# Patient Record
Sex: Male | Born: 1985
Health system: Southern US, Community
[De-identification: ages and names within clinical notes are randomized; demographics above are authoritative.]

---
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
## 2005-11-12 ENCOUNTER — Emergency Department (HOSPITAL_COMMUNITY): Admission: EM | Admit: 2005-11-12 | Discharge: 2005-11-12 | Payer: Self-pay | Admitting: Emergency Medicine

## 2006-02-05 ENCOUNTER — Emergency Department (HOSPITAL_COMMUNITY): Admission: EM | Admit: 2006-02-05 | Discharge: 2006-02-05 | Payer: Self-pay | Admitting: Emergency Medicine

## 2006-12-05 IMAGING — CR DG TIBIA/FIBULA 2V*L*
4 series · 4 of 4 positions shown · non-contrast
Comparison: none

HISTORY: Laceration, fall striking pipe

LEFT TIBIA-FIBULA 2 VIEWS:
No fracture, dislocation, or bone destruction.
Joint spaces preserved.  
Mineralization normal.

[t tib/fib ap left (1 of 2)]
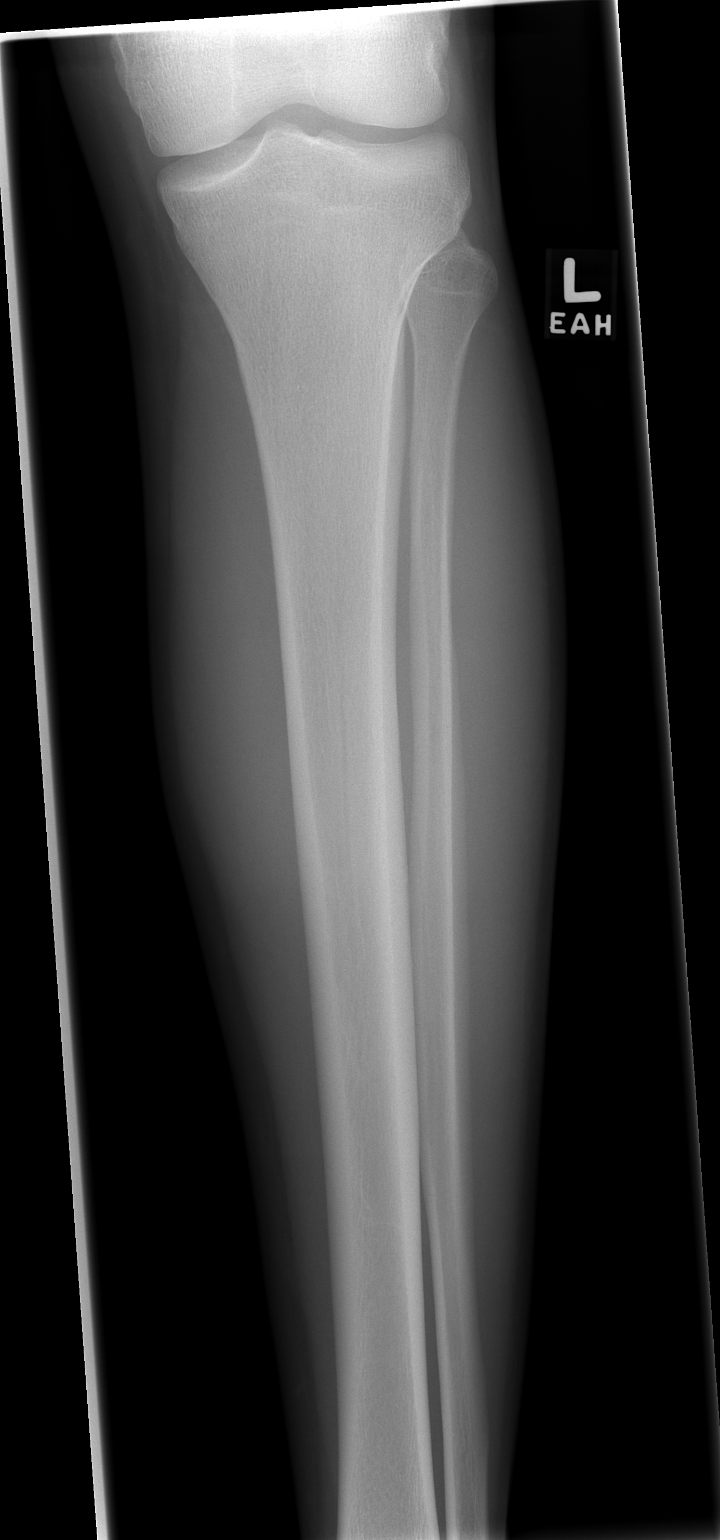

[t tib/fib ap left (2 of 2)]
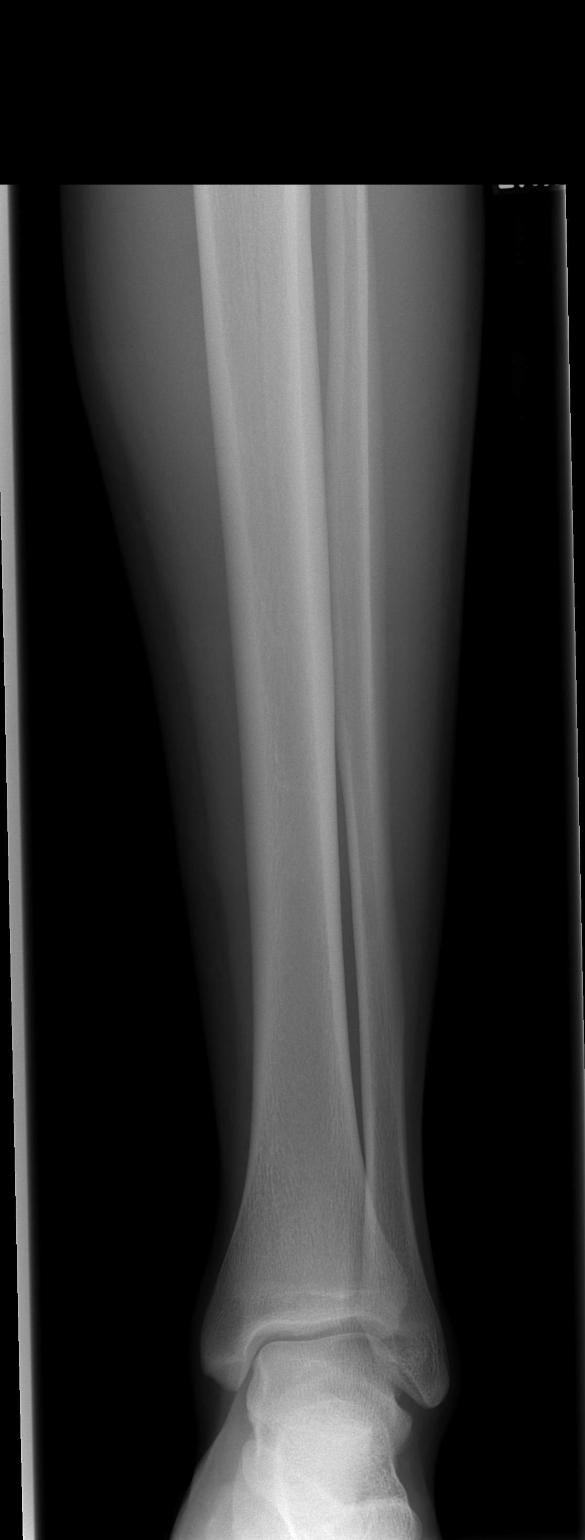

[t tib/fib lat left (1 of 2)]
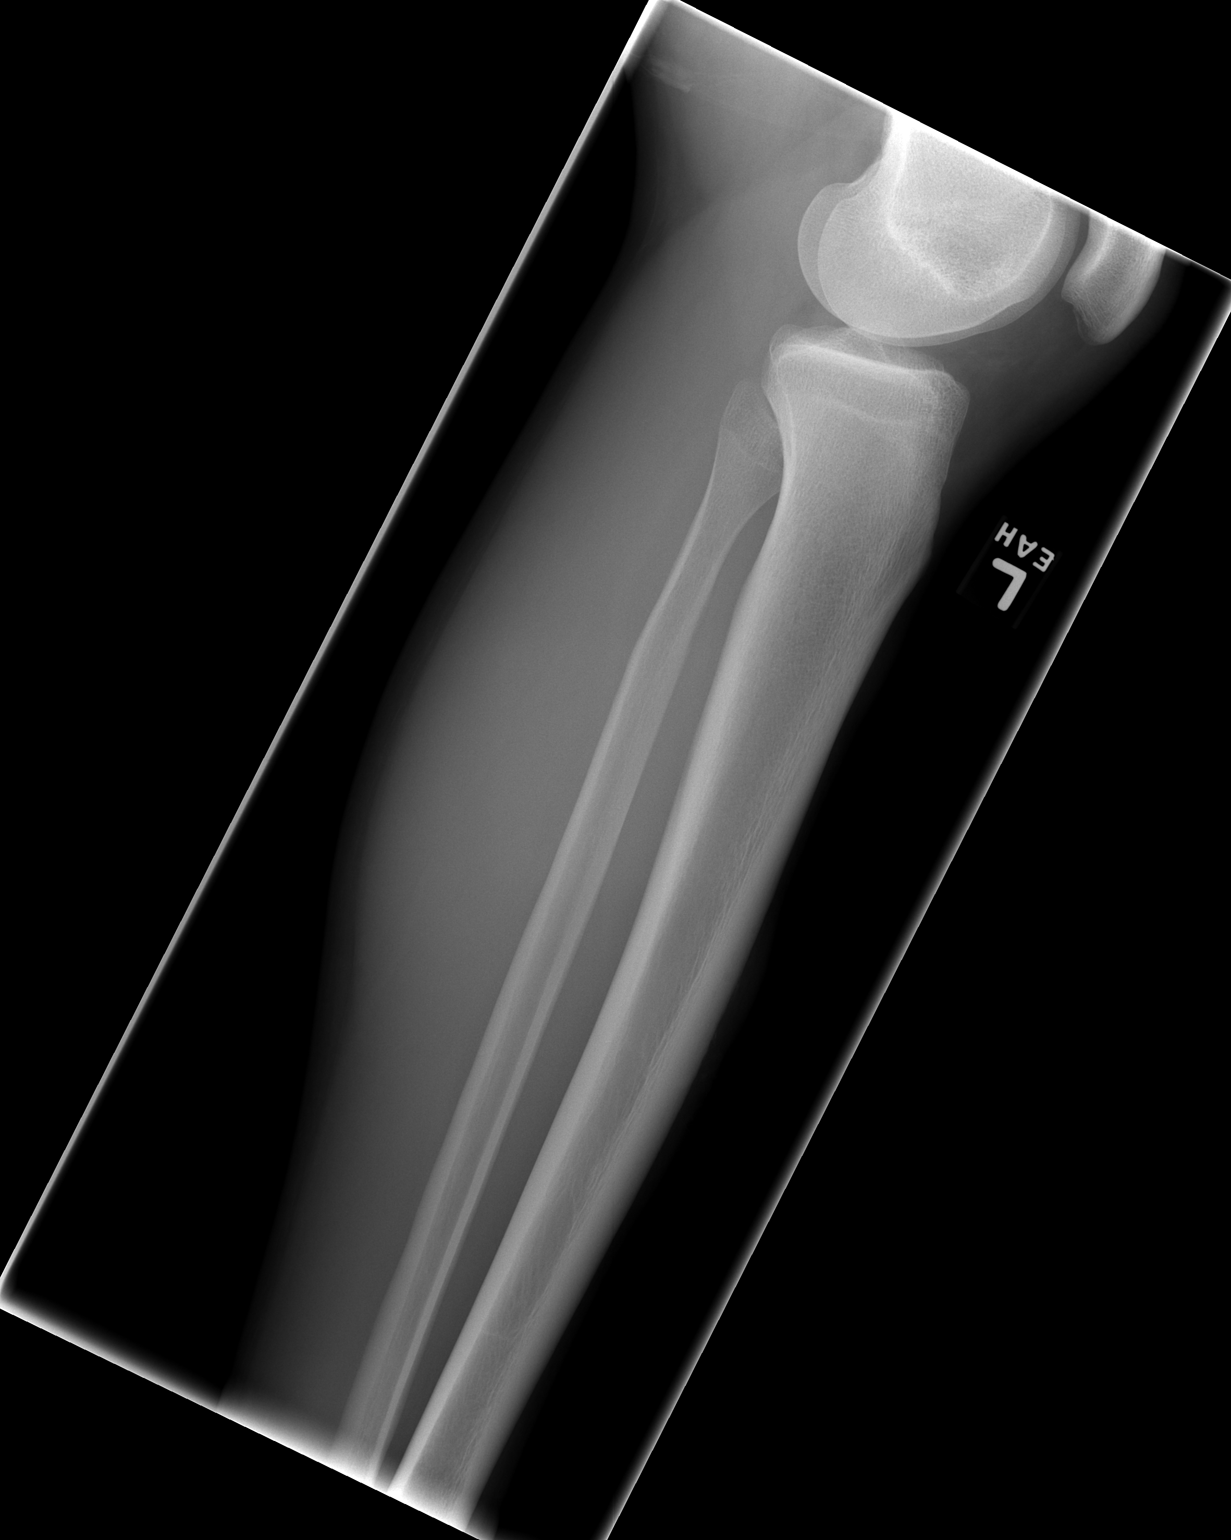

[t tib/fib lat left (2 of 2)]
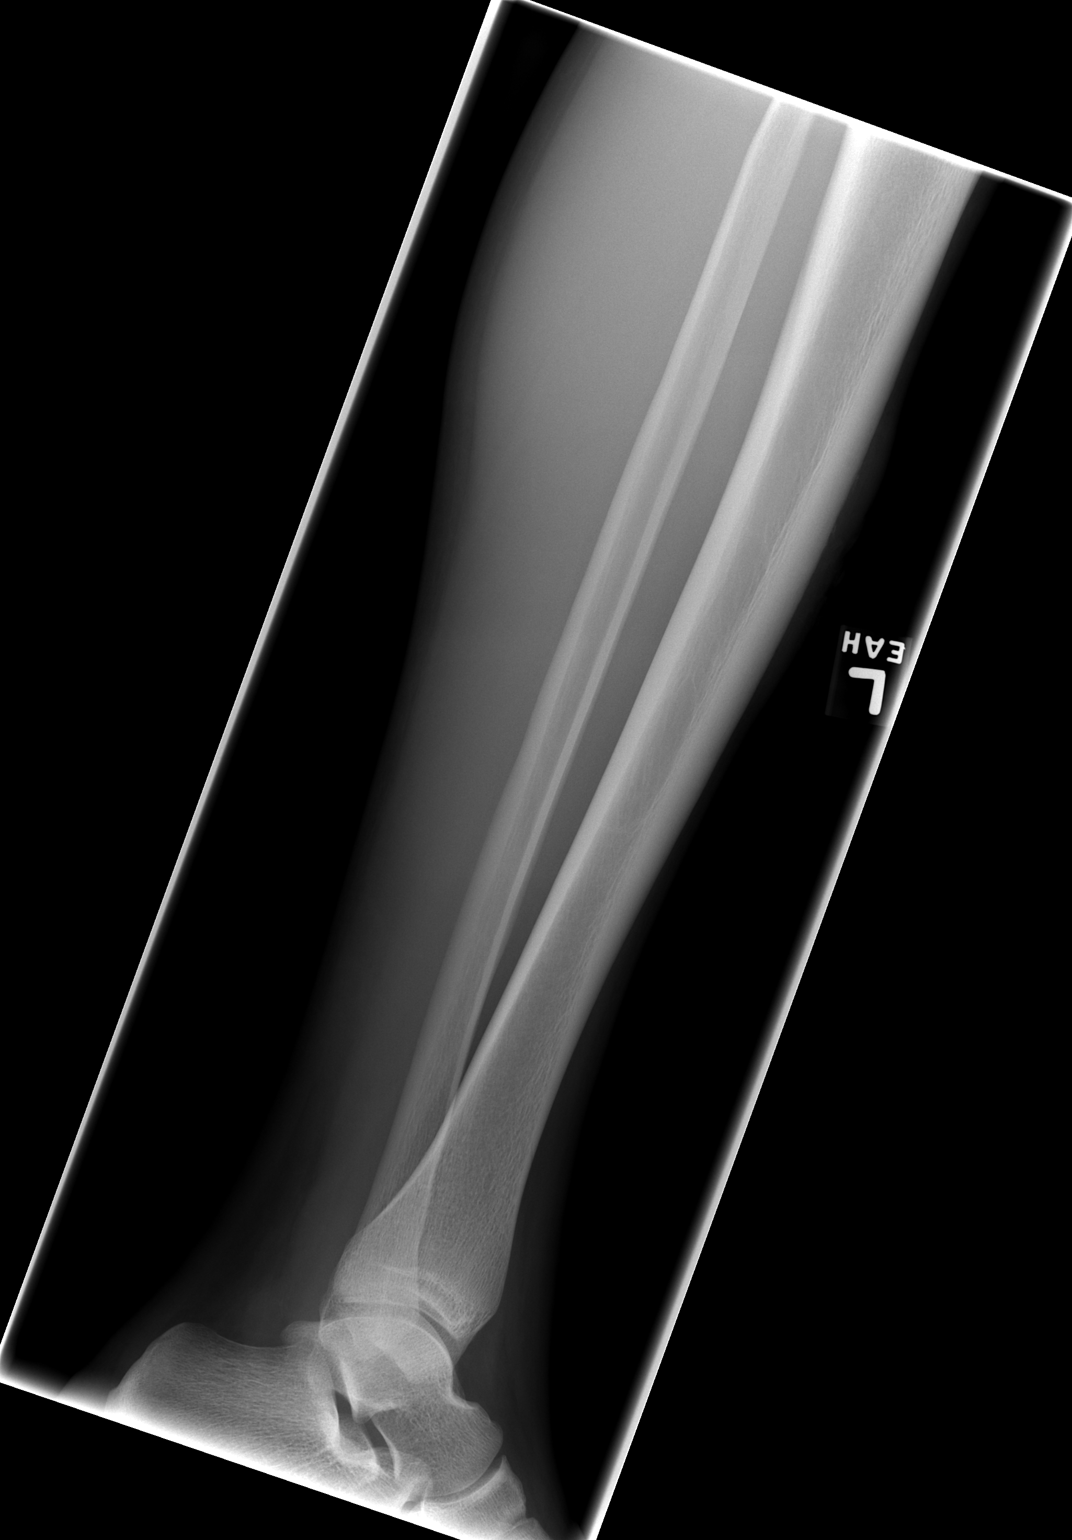

[4 of 4 positions shown; findings below may reference images not displayed]

IMPRESSION: No acute abnormalities.

## 2012-03-17 ENCOUNTER — Ambulatory Visit (INDEPENDENT_AMBULATORY_CARE_PROVIDER_SITE_OTHER): Payer: BC Managed Care – PPO | Admitting: Emergency Medicine

## 2012-03-17 VITALS — BP 110/70 | HR 77 | Temp 98.6°F | Resp 16 | Ht 71.0 in | Wt 183.0 lb

## 2012-03-17 DIAGNOSIS — L0390 Cellulitis, unspecified: Secondary | ICD-10-CM

## 2012-03-17 DIAGNOSIS — L0291 Cutaneous abscess, unspecified: Secondary | ICD-10-CM

## 2012-03-17 MED ORDER — SULFAMETHOXAZOLE-TRIMETHOPRIM 800-160 MG PO TABS: 1.0000 | ORAL_TABLET | Freq: Two times a day (BID) | ORAL | Status: AC

## 2012-03-17 NOTE — Patient Instructions (Signed)

## 2012-03-17 NOTE — Progress Notes (Signed)
  Subjective:    Patient ID: Samuel Neal, male    DOB: [birth year], 26 y.o.   MRN: [id number]  HPI Comments: Sutured left knee and forehead a week ago.  Now has cellulitis around knee wound  Wound Check He was originally treated 5 to 10 days ago. Previous treatment included laceration repair. There has been no drainage from the wound. There is new redness present. There is new swelling present. The pain has new pain. He has no difficulty moving the affected extremity or digit.      Review of Systems  All other systems reviewed and are negative.       Objective:   Physical Exam  Constitutional: He appears well-developed and well-nourished.  HENT:  Head: Normocephalic and atraumatic.  Eyes: Conjunctivae are normal. Pupils are equal, round, and reactive to light.  Neck: Normal range of motion.  Cardiovascular: Normal rate.   Pulmonary/Chest: Effort normal.  Abdominal: Soft.  Skin: Skin is warm and dry. There is erythema.          Assessment & Plan:  Scalp sutures removed and cellulitis knee treated with septra Follow up if no improvement otherwise in one week for suture removal

## 2016-04-11 ENCOUNTER — Ambulatory Visit: Payer: Self-pay | Admitting: Family

## 2016-04-21 ENCOUNTER — Encounter: Payer: Self-pay | Admitting: Family

## 2016-04-21 ENCOUNTER — Ambulatory Visit (INDEPENDENT_AMBULATORY_CARE_PROVIDER_SITE_OTHER): Payer: BLUE CROSS/BLUE SHIELD | Admitting: Family

## 2016-04-21 DIAGNOSIS — Z0000 Encounter for general adult medical examination without abnormal findings: Secondary | ICD-10-CM | POA: Diagnosis not present

## 2016-04-21 DIAGNOSIS — Z0001 Encounter for general adult medical examination with abnormal findings: Secondary | ICD-10-CM | POA: Insufficient documentation

## 2016-04-21 NOTE — Patient Instructions (Addendum)
Thank you for choosing [facility].  Summary/Instructions:  Please stop by the lab on the lower level of the building for your blood work. Your results will be released to MyChart (or called to you) after review, usually within 72 hours after test completion. If any changes need to be made, you will be notified at that same time.  1. The lab is open from 7:30am to 5:30 pm Monday-Friday  2. No appointment is necessary  3. Fasting (if needed) is 6-8 hours after food and drink; black coffee  and water are okay   If your symptoms worsen or fail to improve, please contact our office for further instruction, or in case of emergency go directly to the emergency room at the closest medical facility.   Health Maintenance, Male A healthy lifestyle and preventative care can promote health and wellness.  Maintain regular health, dental, and eye exams.  Eat a healthy diet. Foods like vegetables, fruits, whole grains, low-fat dairy products, and lean protein foods contain the nutrients you need and are low in calories. Decrease your intake of foods high in solid fats, added sugars, and salt. Get information about a proper diet from your health care provider, if necessary.  Regular physical exercise is one of the most important things you can do for your health. Most adults should get at least 150 minutes of moderate-intensity exercise (any activity that increases your heart rate and causes you to sweat) each week. In addition, most adults need muscle-strengthening exercises on 2 or more days a week.   Maintain a healthy weight. The body mass index (BMI) is a screening tool to identify possible weight problems. It provides an estimate of body fat based on height and weight. Your health care provider can find your BMI and can help you achieve or maintain a healthy weight. For males 20 years and older:  A BMI below 18.5 is considered underweight.  A BMI of 18.5 to 24.9 is normal.  A BMI of 25 to  29.9 is considered overweight.  A BMI of 30 and above is considered obese.  Maintain normal blood lipids and cholesterol by exercising and minimizing your intake of saturated fat. Eat a balanced diet with plenty of fruits and vegetables. Blood tests for lipids and cholesterol should begin at age 20 and be repeated every 5 years. If your lipid or cholesterol levels are high, you are over age 50, or you are at high risk for heart disease, you may need your cholesterol levels checked more frequently.Ongoing high lipid and cholesterol levels should be treated with medicines if diet and exercise are not working.  If you smoke, find out from your health care provider how to quit. If you do not use tobacco, do not start.  Lung cancer screening is recommended for adults aged 55-80 years who are at high risk for developing lung cancer because of a history of smoking. A yearly low-dose CT scan of the lungs is recommended for people who have at least a 30-pack-year history of smoking and are current smokers or have quit within the past 15 years. A pack year of smoking is smoking an average of 1 pack of cigarettes a day for 1 year (for example, a 30-pack-year history of smoking could mean smoking 1 pack a day for 30 years or 2 packs a day for 15 years). Yearly screening should continue until the smoker has stopped smoking for at least 15 years. Yearly screening should be stopped for people who develop a   health problem that would prevent them from having lung cancer treatment.  If you choose to drink alcohol, do not have more than 2 drinks per day. One drink is considered to be 12 oz (360 mL) of beer, 5 oz (150 mL) of wine, or 1.5 oz (45 mL) of liquor.  Avoid the use of street drugs. Do not share needles with anyone. Ask for help if you need support or instructions about stopping the use of drugs.  High blood pressure causes heart disease and increases the risk of stroke. High blood pressure is more likely to  develop in:  People who have blood pressure in the end of the normal range (100-139/85-89 mm Hg).  People who are overweight or obese.  People who are African American.  If you are 18-39 years of age, have your blood pressure checked every 3-5 years. If you are 40 years of age or older, have your blood pressure checked every year. You should have your blood pressure measured twice--once when you are at a hospital or clinic, and once when you are not at a hospital or clinic. Record the average of the two measurements. To check your blood pressure when you are not at a hospital or clinic, you can use:  An automated blood pressure machine at a pharmacy.  A home blood pressure monitor.  If you are 45-79 years old, ask your health care provider if you should take aspirin to prevent heart disease.  Diabetes screening involves taking a blood sample to check your fasting blood sugar level. This should be done once every 3 years after age 45 if you are at a normal weight and without risk factors for diabetes. Testing should be considered at a younger age or be carried out more frequently if you are overweight and have at least 1 risk factor for diabetes.  Colorectal cancer can be detected and often prevented. Most routine colorectal cancer screening begins at the age of 50 and continues through age 75. However, your health care provider may recommend screening at an earlier age if you have risk factors for colon cancer. On a yearly basis, your health care provider may provide home test kits to check for hidden blood in the stool. A small camera at the end of a tube may be used to directly examine the colon (sigmoidoscopy or colonoscopy) to detect the earliest forms of colorectal cancer. Talk to your health care provider about this at age 50 when routine screening begins. A direct exam of the colon should be repeated every 5-10 years through age 75, unless early forms of precancerous polyps or small growths  are found.  People who are at an increased risk for hepatitis B should be screened for this virus. You are considered at high risk for hepatitis B if:  You were born in a country where hepatitis B occurs often. Talk with your health care provider about which countries are considered high risk.  Your parents were born in a high-risk country and you have not received a shot to protect against hepatitis B (hepatitis B vaccine).  You have HIV or AIDS.  You use needles to inject street drugs.  You live with, or have sex with, someone who has hepatitis B.  You are a man who has sex with other men (MSM).  You get hemodialysis treatment.  You take certain medicines for conditions like cancer, organ transplantation, and autoimmune conditions.  Hepatitis C blood testing is recommended for all people born from 1945   through 1965 and any individual with known risk factors for hepatitis C.  Healthy men should no longer receive prostate-specific antigen (PSA) blood tests as part of routine cancer screening. Talk to your health care provider about prostate cancer screening.  Testicular cancer screening is not recommended for adolescents or adult males who have no symptoms. Screening includes self-exam, a health care provider exam, and other screening tests. Consult with your health care provider about any symptoms you have or any concerns you have about testicular cancer.  Practice safe sex. Use condoms and avoid high-risk sexual practices to reduce the spread of sexually transmitted infections (STIs).  You should be screened for STIs, including gonorrhea and chlamydia if:  You are sexually active and are younger than 24 years.  You are older than 24 years, and your health care provider tells you that you are at risk for this type of infection.  Your sexual activity has changed since you were last screened, and you are at an increased risk for chlamydia or gonorrhea. Ask your health care provider  if you are at risk.  If you are at risk of being infected with HIV, it is recommended that you take a prescription medicine daily to prevent HIV infection. This is called pre-exposure prophylaxis (PrEP). You are considered at risk if:  You are a man who has sex with other men (MSM).  You are a heterosexual man who is sexually active with multiple partners.  You take drugs by injection.  You are sexually active with a partner who has HIV.  Talk with your health care provider about whether you are at high risk of being infected with HIV. If you choose to begin PrEP, you should first be tested for HIV. You should then be tested every 3 months for as long as you are taking PrEP.  Use sunscreen. Apply sunscreen liberally and repeatedly throughout the day. You should seek shade when your shadow is shorter than you. Protect yourself by wearing long sleeves, pants, a wide-brimmed hat, and sunglasses year round whenever you are outdoors.  Tell your health care provider of new moles or changes in moles, especially if there is a change in shape or color. Also, tell your health care provider if a mole is larger than the size of a pencil eraser.  A one-time screening for abdominal aortic aneurysm (AAA) and surgical repair of large AAAs by ultrasound is recommended for men aged 65-75 years who are current or former smokers.  Stay current with your vaccines (immunizations).   This information is not intended to replace advice given to you by your health care provider. Make sure you discuss any questions you have with your health care provider.   Document Released: 02/25/2008 Document Revised: 09/19/2014 Document Reviewed: 01/24/2011 Elsevier Interactive Patient Education 2016 [employer]   

## 2016-04-21 NOTE — Assessment & Plan Note (Signed)
1) Anticipatory Guidance: Discussed importance of wearing a seatbelt while driving and not texting while driving; changing batteries in smoke detector at least once annually; wearing suntan lotion when outside; eating a balanced and moderate diet; getting physical activity at least 30 minutes per day.  2) Immunizations / Screenings / Labs:  All immunizations are up-to-date per recommendations. Obtain testosterone for testosterone screening. Due for a dental exam encouraged to be completed independently. All other screenings are up-to-date per recommendations. Obtain CBC, CMET, Lipid profile and TSH.    Overall well exam with risk factors for cardiovascular disease being minimal at this time. He is of good weight and exercises regularly. Emphasize importance of good nutritional intake that is moderate, balance, and varied. Continue healthy last behaviors and choices. Follow-up prevention exam in 1 year. Follow-up office visit pending blood work as necessary.

## 2016-04-21 NOTE — Progress Notes (Signed)
Subjective:    Patient ID: Samuel Neal, male    DOB: 04/04/1986, 30 y.o.   MRN: [id number]  Chief Complaint  Patient presents with  . Establish Care    CPE, not fasting    HPI:  CRISTOFHER Neal is a 30 y.o. male who presents today for an annual wellness visit.   1) Health Maintenance -   Diet - Averages about 3 meals per day consisting of a regular diet; Rare fast/processed foods; Caffeine intake of 4-5 cups per day.   Exercise - Couple of times per week; mountain bike   2) Preventative Exams / Immunizations:  Dental -- Due for exam  Vision -- Up to date   Health Maintenance  Topic Date Due  . HIV Screening  04/28/2001  . INFLUENZA VACCINE  04/12/2016  . TETANUS/TDAP  11/11/2019     There is no immunization history on file for this patient.  No Known Allergies   No outpatient prescriptions prior to visit.   No facility-administered medications prior to visit.      History reviewed. No pertinent past medical history.   History reviewed. No pertinent surgical history.   Family History  Problem Relation Age of Onset  . Healthy Mother   . Heart disease Father   . Healthy Maternal Grandfather   . Glaucoma Paternal Grandmother   . Cancer Paternal Grandfather      Social History   Social History  . Marital status: Married    Spouse name: N/A  . Number of children: 0  . Years of education: 49   Occupational History  . Sales    Social History Main Topics  . Smoking status: Never Smoker  . Smokeless tobacco: Never Used  . Alcohol use 12.6 oz/week    21 Cans of beer per week  . Drug use: No  . Sexual activity: Not on file   Other Topics Concern  . Not on file   Social History Narrative   Fun: Mountain bike, motor cycles      Review of Systems  Constitutional: Denies fever, chills, fatigue, or significant weight gain/loss. HENT: Head: Denies headache or neck pain Ears: Denies changes in hearing, ringing in ears, earache,  drainage Nose: Denies discharge, stuffiness, itching, nosebleed, sinus pain Throat: Denies sore throat, hoarseness, dry mouth, sores, thrush Eyes: Denies loss/changes in vision, pain, redness, blurry/double vision, flashing lights Cardiovascular: Denies chest pain/discomfort, tightness, palpitations, shortness of breath with activity, difficulty lying down, swelling, sudden awakening with shortness of breath Respiratory: Denies shortness of breath, cough, sputum production, wheezing Gastrointestinal: Denies dysphasia, heartburn, change in appetite, nausea, change in bowel habits, rectal bleeding, constipation, diarrhea, yellow skin or eyes Genitourinary: Denies frequency, urgency, burning/pain, blood in urine, incontinence, change in urinary strength. Musculoskeletal: Denies muscle/joint pain, stiffness, back pain, redness or swelling of joints, trauma Skin: Denies rashes, lumps, itching, dryness, color changes, or hair/nail changes Neurological: Denies dizziness, fainting, seizures, weakness, numbness, tingling, tremor Psychiatric - Denies nervousness, stress, depression or memory loss Endocrine: Denies heat or cold intolerance, sweating, frequent urination, excessive thirst, changes in appetite Hematologic: Denies ease of bruising or bleeding     Objective:     BP 136/84 (BP Location: Left Arm, Patient Position: Sitting, Cuff Size: Normal)   Pulse 69   Temp 98.2 F (36.8 C) (Oral)   Resp 16   Ht  (1.803 m)   Wt 165 lb (74.8 kg)   SpO2 96%   BMI 23.01 kg/m  Nursing note  and vital signs reviewed.  Physical Exam  Constitutional: He is oriented to person, place, and time. He appears well-developed and well-nourished.  HENT:  Head: Normocephalic.  Right Ear: Hearing, tympanic membrane, external ear and ear canal normal.  Left Ear: Hearing, tympanic membrane, external ear and ear canal normal.  Nose: Nose normal.  Mouth/Throat: Uvula is midline, oropharynx is clear and moist  and mucous membranes are normal.  Eyes: Conjunctivae and EOM are normal. Pupils are equal, round, and reactive to light.  Neck: Neck supple. No JVD present. No tracheal deviation present. No thyromegaly present.  Cardiovascular: Normal rate, regular rhythm, normal heart sounds and intact distal pulses.   Pulmonary/Chest: Effort normal and breath sounds normal.  Abdominal: Soft. Bowel sounds are normal. He exhibits no distension and no mass. There is no tenderness. There is no rebound and no guarding.  Musculoskeletal: Normal range of motion. He exhibits no edema or tenderness.  Lymphadenopathy:    He has no cervical adenopathy.  Neurological: He is alert and oriented to person, place, and time. He has normal reflexes. No cranial nerve deficit. He exhibits normal muscle tone. Coordination normal.  Skin: Skin is warm and dry.  Psychiatric: He has a normal mood and affect. His behavior is normal. Judgment and thought content normal.       Assessment & Plan:   Problem List Items Addressed This Visit      Other   Routine general medical examination at a health care facility    1) Anticipatory Guidance: Discussed importance of wearing a seatbelt while driving and not texting while driving; changing batteries in smoke detector at least once annually; wearing suntan lotion when outside; eating a balanced and moderate diet; getting physical activity at least 30 minutes per day.  2) Immunizations / Screenings / Labs:  All immunizations are up-to-date per recommendations. Obtain testosterone for testosterone screening. Due for a dental exam encouraged to be completed independently. All other screenings are up-to-date per recommendations. Obtain CBC, CMET, Lipid profile and TSH.    Overall well exam with risk factors for cardiovascular disease being minimal at this time. He is of good weight and exercises regularly. Emphasize importance of good nutritional intake that is moderate, balance, and varied.  Continue healthy last behaviors and choices. Follow-up prevention exam in 1 year. Follow-up office visit pending blood work as necessary.       Relevant Orders   CBC   Comprehensive metabolic panel   Lipid panel   TSH   Testosterone    Other Visit Diagnoses   None.      Mr. Jenne PaneBates does not currently have medications on file.    Follow-up: Return in about 1 year (around 04/21/2017), or if symptoms worsen or fail to improve.   Jeanine Luzalone, [place], FNP

## 2016-05-19 ENCOUNTER — Other Ambulatory Visit (INDEPENDENT_AMBULATORY_CARE_PROVIDER_SITE_OTHER): Payer: BLUE CROSS/BLUE SHIELD

## 2016-05-19 ENCOUNTER — Encounter: Payer: Self-pay | Admitting: Family

## 2016-05-19 DIAGNOSIS — Z0000 Encounter for general adult medical examination without abnormal findings: Secondary | ICD-10-CM | POA: Diagnosis not present

## 2016-05-19 LAB — CBC
HCT: 41.5 % (ref 39.0–52.0)
Hemoglobin: 14.4 g/dL (ref 13.0–17.0)
MCHC: 34.7 g/dL (ref 30.0–36.0)
MCV: 88.4 fl (ref 78.0–100.0)
Platelets: 168 10*3/uL (ref 150.0–400.0)
RBC: 4.7 Mil/uL (ref 4.22–5.81)
RDW: 12.1 % (ref 11.5–15.5)
WBC: 5 10*3/uL (ref 4.0–10.5)

## 2016-05-19 LAB — LIPID PANEL
Cholesterol: 195 mg/dL (ref 0–200)
HDL: 56.9 mg/dL (ref >39.00)
LDL Cholesterol: 119 mg/dL — ABNORMAL HIGH (ref 0–99)
NonHDL: 137.75
Total CHOL/HDL Ratio: 3
Triglycerides: 96 mg/dL (ref 0.0–149.0)
VLDL: 19.2 mg/dL (ref 0.0–40.0)

## 2016-05-19 LAB — COMPREHENSIVE METABOLIC PANEL
ALT: 28 U/L (ref 0–53)
AST: 21 U/L (ref 0–37)
Albumin: 4.8 g/dL (ref 3.5–5.2)
Alkaline Phosphatase: 64 U/L (ref 39–117)
BUN: 9 mg/dL (ref 6–23)
CO2: 29 mEq/L (ref 19–32)
Calcium: 9.4 mg/dL (ref 8.4–10.5)
Chloride: 100 mEq/L (ref 96–112)
Creatinine, Ser: 0.96 mg/dL (ref 0.40–1.50)
GFR: 97.71 mL/min (ref >60.00)
Glucose, Bld: 92 mg/dL (ref 70–99)
Potassium: 4.2 mEq/L (ref 3.5–5.1)
Sodium: 136 mEq/L (ref 135–145)
Total Bilirubin: 0.8 mg/dL (ref 0.2–1.2)
Total Protein: 7.3 g/dL (ref 6.0–8.3)

## 2016-05-19 LAB — TESTOSTERONE: Testosterone: 316.48 ng/dL (ref 300.00–890.00)

## 2016-05-19 LAB — TSH: TSH: 1.23 u[IU]/mL (ref 0.35–4.50)

## 2017-12-04 ENCOUNTER — Ambulatory Visit: Payer: BLUE CROSS/BLUE SHIELD | Admitting: Family

## 2017-12-04 ENCOUNTER — Encounter: Payer: Self-pay | Admitting: Family

## 2017-12-04 VITALS — BP 110/78 | HR 89 | Temp 98.3°F | Ht 71.0 in | Wt 171.1 lb

## 2017-12-04 DIAGNOSIS — Z23 Encounter for immunization: Secondary | ICD-10-CM | POA: Diagnosis not present

## 2017-12-04 DIAGNOSIS — T148XXA Other injury of unspecified body region, initial encounter: Secondary | ICD-10-CM | POA: Diagnosis not present

## 2017-12-04 MED ORDER — MUPIROCIN CALCIUM 2 % EX CREA: 1.0000 "application " | TOPICAL_CREAM | Freq: Two times a day (BID) | CUTANEOUS | 0 refills | Status: DC

## 2017-12-04 NOTE — Patient Instructions (Signed)

## 2017-12-04 NOTE — Progress Notes (Signed)
  Samuel ParkerChristopher K Neal is a 32 y.o. male with the following history as recorded in [facility]:  Patient Active Problem List   Diagnosis Date Noted  . Routine general medical examination at a health care facility 04/21/2016    Current Outpatient Medications  Medication Sig Dispense Refill  . mupirocin cream (BACTROBAN) 2 % Apply 1 application topically 2 (two) times daily. 15 g 0   No current facility-administered medications for this visit.     Allergies: Patient has no known allergies.  History reviewed. No pertinent past medical history.  History reviewed. No pertinent surgical history.  Family History  Problem Relation Age of Onset  . Healthy Mother   . Heart disease Father   . Healthy Maternal Grandfather   . Glaucoma Paternal Grandmother   . Cancer Paternal Grandfather     Social History   Tobacco Use  . Smoking status: Never Smoker  . Smokeless tobacco: Never Used  Substance Use Topics  . Alcohol use: Yes    Alcohol/week: 12.6 oz    Types: 21 Cans of beer per week    Subjective:  Patient presents with concerns for cut on bottom of 1st toe right foot; stepped on rusty nail/ board on Saturday night and cut his foot; has been cleaning with Hydrogen Peroxide; notes that mildly painful to step on toe; had a family member who is a paramedic evaluate toe on Saturday night after injury and did not feel sutures needed; also prefers not to take any type of oral antibiotic unless absolutely necessary.   Objective:  Vitals:   12/04/17 0826  BP: 110/78  Pulse: 89  Temp: 98.3 F (36.8 C)  TempSrc: Oral  SpO2: 98%  Weight: 171 lb 1.4 oz (77.6 kg)  Height: 5\' 11"  (1.803 m)    General: Well developed, well nourished, in no acute distress  Skin : Warm and dry. Laceration noted on bottom of 1st toe right foot- blood blister noted on bottom of toe; no warmth or streaking;  Head: Normocephalic and atraumatic  Lungs: Respirations unlabored; clear to auscultation bilaterally without  wheeze, rales, rhonchi  Neurologic: Alert and oriented; speech intact; face symmetrical; moves all extremities well; CNII-XII intact without focal deficit   Assessment:  1. Wound of skin     Plan:  Tdap updated; will treat with Bactroban; encouraged to keep area clean, covered; follow-up worse, no better.  Consider CPE later this year as last exam in 04/2016.  No follow-ups on file.  Orders Placed This Encounter  Procedures  . Tdap vaccine greater than or equal to 7yo IM    Requested Prescriptions   Signed Prescriptions Disp Refills  . mupirocin cream (BACTROBAN) 2 % 15 g 0    Sig: Apply 1 application topically 2 (two) times daily.

## 2017-12-07 ENCOUNTER — Telehealth: Payer: Self-pay

## 2017-12-07 NOTE — Telephone Encounter (Signed)
Key: [id number]  Started PA for Mupirocin Cream today on Cover My Meds.

## 2017-12-11 ENCOUNTER — Other Ambulatory Visit (INDEPENDENT_AMBULATORY_CARE_PROVIDER_SITE_OTHER): Payer: BLUE CROSS/BLUE SHIELD

## 2017-12-11 ENCOUNTER — Ambulatory Visit: Payer: BLUE CROSS/BLUE SHIELD | Admitting: Internal Medicine

## 2017-12-11 ENCOUNTER — Encounter: Payer: Self-pay | Admitting: Internal Medicine

## 2017-12-11 VITALS — BP 122/78 | HR 83 | Temp 97.7°F | Ht 71.0 in | Wt 174.0 lb

## 2017-12-11 DIAGNOSIS — Z0000 Encounter for general adult medical examination without abnormal findings: Secondary | ICD-10-CM

## 2017-12-11 DIAGNOSIS — Z114 Encounter for screening for human immunodeficiency virus [HIV]: Secondary | ICD-10-CM

## 2017-12-11 DIAGNOSIS — E785 Hyperlipidemia, unspecified: Secondary | ICD-10-CM

## 2017-12-11 HISTORY — DX: Hyperlipidemia, unspecified: E78.5

## 2017-12-11 LAB — URINALYSIS, ROUTINE W REFLEX MICROSCOPIC
Bilirubin Urine: NEGATIVE
Hgb urine dipstick: NEGATIVE
Ketones, ur: NEGATIVE
Leukocytes, UA: NEGATIVE
Nitrite: NEGATIVE
RBC / HPF: NONE SEEN (ref 0–?)
Specific Gravity, Urine: <=1.005 — AB (ref 1.000–1.030)
Total Protein, Urine: NEGATIVE
Urine Glucose: NEGATIVE
Urobilinogen, UA: 0.2 (ref 0.0–1.0)
WBC, UA: NONE SEEN (ref 0–?)
pH: 6 (ref 5.0–8.0)

## 2017-12-11 LAB — HEPATIC FUNCTION PANEL
ALT: 27 U/L (ref 0–53)
AST: 21 U/L (ref 0–37)
Albumin: 4.7 g/dL (ref 3.5–5.2)
Alkaline Phosphatase: 71 U/L (ref 39–117)
Bilirubin, Direct: 0.1 mg/dL (ref 0.0–0.3)
Total Bilirubin: 0.6 mg/dL (ref 0.2–1.2)
Total Protein: 7.4 g/dL (ref 6.0–8.3)

## 2017-12-11 LAB — BASIC METABOLIC PANEL
BUN: 11 mg/dL (ref 6–23)
CO2: 31 mEq/L (ref 19–32)
Calcium: 9.8 mg/dL (ref 8.4–10.5)
Chloride: 97 mEq/L (ref 96–112)
Creatinine, Ser: 0.87 mg/dL (ref 0.40–1.50)
GFR: 108.35 mL/min (ref >60.00)
Glucose, Bld: 87 mg/dL (ref 70–99)
Potassium: 4.2 mEq/L (ref 3.5–5.1)
Sodium: 135 mEq/L (ref 135–145)

## 2017-12-11 LAB — LIPID PANEL
Cholesterol: 194 mg/dL (ref 0–200)
HDL: 57 mg/dL (ref >39.00)
LDL Cholesterol: 112 mg/dL — ABNORMAL HIGH (ref 0–99)
NonHDL: 136.89
Total CHOL/HDL Ratio: 3
Triglycerides: 124 mg/dL (ref 0.0–149.0)
VLDL: 24.8 mg/dL (ref 0.0–40.0)

## 2017-12-11 LAB — CBC WITH DIFFERENTIAL/PLATELET
Basophils Absolute: 0 10*3/uL (ref 0.0–0.1)
Basophils Relative: 0.6 % (ref 0.0–3.0)
Eosinophils Absolute: 0.1 10*3/uL (ref 0.0–0.7)
Eosinophils Relative: 2 % (ref 0.0–5.0)
HCT: 42.2 % (ref 39.0–52.0)
Hemoglobin: 14.4 g/dL (ref 13.0–17.0)
Lymphocytes Relative: 26.5 % (ref 12.0–46.0)
Lymphs Abs: 1.2 10*3/uL (ref 0.7–4.0)
MCHC: 34.1 g/dL (ref 30.0–36.0)
MCV: 90.4 fl (ref 78.0–100.0)
Monocytes Absolute: 0.5 10*3/uL (ref 0.1–1.0)
Monocytes Relative: 10.5 % (ref 3.0–12.0)
Neutro Abs: 2.7 10*3/uL (ref 1.4–7.7)
Neutrophils Relative %: 60.4 % (ref 43.0–77.0)
Platelets: 162 10*3/uL (ref 150.0–400.0)
RBC: 4.66 Mil/uL (ref 4.22–5.81)
RDW: 12.6 % (ref 11.5–15.5)
WBC: 4.5 10*3/uL (ref 4.0–10.5)

## 2017-12-11 LAB — TSH: TSH: 0.91 u[IU]/mL (ref 0.35–4.50)

## 2017-12-11 NOTE — Patient Instructions (Signed)

## 2017-12-11 NOTE — Progress Notes (Signed)
Subjective:    Patient ID: Samuel ParkerChristopher K Hartsough, male    DOB: May 07, 1986, 32 y.o.   MRN: [id number]  HPI  Here for wellness and f/u;  Overall doing ok;  Pt denies Chest pain, worsening SOB, DOE, wheezing, orthopnea, PND, worsening LE edema, palpitations, dizziness or syncope.  Pt denies neurological change such as new headache, facial or extremity weakness.  Pt denies polydipsia, polyuria, or low sugar symptoms. Pt states overall good compliance with treatment and medications, good tolerability, and has been trying to follow appropriate diet.  Pt denies worsening depressive symptoms, suicidal ideation or panic. No fever, night sweats, wt loss, loss of appetite, or other constitutional symptoms.  Pt states good ability with ADL's, has low fall risk, home safety reviewed and adequate, no other significant changes in hearing or vision, and only occasionally active with exercise. Did have small laceration to right foot now healing without s/s infection.  Had Tdap last wk.  No other interval hx or complaints No past medical history on file. No past surgical history on file.  reports that he has never smoked. He has never used smokeless tobacco. He reports that he drinks about 12.6 oz of alcohol per week. He reports that he does not use drugs. family history includes Cancer in his paternal grandfather; Glaucoma in his paternal grandmother; Healthy in his maternal grandfather and mother; Heart disease in his father. No Known Allergies Current Outpatient Medications on File Prior to Visit  Medication Sig Dispense Refill  . mupirocin cream (BACTROBAN) 2 % Apply 1 application topically 2 (two) times daily. 15 g 0   No current facility-administered medications on file prior to visit.    Review of Systems Constitutional: Negative for other unusual diaphoresis, sweats, appetite or weight changes HENT: Negative for other worsening hearing loss, ear pain, facial swelling, mouth sores or neck stiffness.   Eyes:  Negative for other worsening pain, redness or other visual disturbance.  Respiratory: Negative for other stridor or swelling Cardiovascular: Negative for other palpitations or other chest pain  Gastrointestinal: Negative for worsening diarrhea or loose stools, blood in stool, distention or other pain Genitourinary: Negative for hematuria, flank pain or other change in urine volume.  Musculoskeletal: Negative for myalgias or other joint swelling.  Skin: Negative for other color change, or other wound or worsening drainage.  Neurological: Negative for other syncope or numbness. Hematological: Negative for other adenopathy or swelling Psychiatric/Behavioral: Negative for hallucinations, other worsening agitation, SI, self-injury, or new decreased concentration All other system neg per pt    Objective:   Physical Exam BP 122/78   Pulse 83   Temp 97.7 F (36.5 C) (Oral)   Ht 5\' 11"  (1.803 m)   Wt 174 lb (78.9 kg)   SpO2 98%   BMI 24.27 kg/m  VS noted,  Constitutional: Pt is oriented to person, place, and time. Appears well-developed and well-nourished, in no significant distress and comfortable Head: Normocephalic and atraumatic  Eyes: Conjunctivae and EOM are normal. Pupils are equal, round, and reactive to light Right Ear: External ear normal without discharge Left Ear: External ear normal without discharge Nose: Nose without discharge or deformity Mouth/Throat: Oropharynx is without other ulcerations and moist  Neck: Normal range of motion. Neck supple. No JVD present. No tracheal deviation present or significant neck LA or mass Cardiovascular: Normal rate, regular rhythm, normal heart sounds and intact distal pulses. Pulmonary/Chest: WOB normal and breath sounds without rales or wheezing  Abdominal: Soft. Bowel sounds are normal. NT. No  HSM  Musculoskeletal: Normal range of motion. Exhibits no edema Lymphadenopathy: Has no other cervical adenopathy.  Neurological: Pt is alert and  oriented to person, place, and time. Pt has normal reflexes. No cranial nerve deficit. Motor grossly intact, Gait intact Skin: Skin is warm and dry. No rash noted or new ulcerations, has several moles to torso Psychiatric:  Has normal mood and affect. Behavior is normal without agitation No other exam findings Lab Results  Component Value Date   WBC 5.0 05/19/2016   HGB 14.4 05/19/2016   HCT 41.5 05/19/2016   PLT 168.0 05/19/2016   GLUCOSE 92 05/19/2016   CHOL 195 05/19/2016   TRIG 96.0 05/19/2016   HDL 56.90 05/19/2016   LDLCALC 119 (H) 05/19/2016   ALT 28 05/19/2016   AST 21 05/19/2016   NA 136 05/19/2016   K 4.2 05/19/2016   CL 100 05/19/2016   CREATININE 0.96 05/19/2016   BUN 9 05/19/2016   CO2 29 05/19/2016   TSH 1.23 05/19/2016       Assessment & Plan:

## 2017-12-11 NOTE — Assessment & Plan Note (Signed)

## 2017-12-12 LAB — HIV ANTIBODY (ROUTINE TESTING W REFLEX): HIV 1&2 Ab, 4th Generation: NONREACTIVE

## 2017-12-14 ENCOUNTER — Other Ambulatory Visit: Payer: Self-pay | Admitting: Family

## 2017-12-14 ENCOUNTER — Telehealth: Payer: Self-pay | Admitting: Family

## 2017-12-14 MED ORDER — MUPIROCIN 2 % EX OINT: 1.0000 "application " | TOPICAL_OINTMENT | Freq: Two times a day (BID) | CUTANEOUS | 0 refills | Status: DC

## 2017-12-14 NOTE — Telephone Encounter (Signed)
His insurance would not approve the Bactroban cream I had prescribed for him; I don't know if he even needs it now but I tried sending it in as an ointment instead. Sometimes they will pay for this when they won't pay for a cream. I am sorry I didn't realize they had not approved the medication until I got a notice on Monday.

## 2017-12-15 NOTE — Telephone Encounter (Signed)
Called and left message for patient.

## 2018-03-16 ENCOUNTER — Encounter: Payer: Self-pay | Admitting: Internal Medicine

## 2018-03-16 ENCOUNTER — Ambulatory Visit: Payer: BLUE CROSS/BLUE SHIELD | Admitting: Internal Medicine

## 2018-03-16 VITALS — BP 124/84 | HR 73 | Temp 98.4°F | Ht 71.0 in | Wt 175.0 lb

## 2018-03-16 DIAGNOSIS — H6092 Unspecified otitis externa, left ear: Secondary | ICD-10-CM

## 2018-03-16 MED ORDER — NEOMYCIN-POLYMYXIN-HC 1 % OT SOLN: 3.0000 [drp] | Freq: Four times a day (QID) | OTIC | 0 refills | Status: AC

## 2018-03-16 NOTE — Patient Instructions (Signed)
Please take all new medication as prescribed - the ear drops  Please continue all other medications as before, and refills have been done if requested.  Please have the pharmacy call with any other refills you may need.  Please keep your appointments with your specialists as you may have planned   

## 2018-03-16 NOTE — Assessment & Plan Note (Signed)
Early onset, mild, for cortisporin OT soln asd,  to f/u any worsening symptoms or concerns

## 2018-03-16 NOTE — Progress Notes (Signed)
   Subjective:    Patient ID: Samuel ParkerChristopher K Lierman, male    DOB: 1985-10-08, 32 y.o.   MRN: [id number]  HPI  Here with 1 days onset left ear pain with irritation worse with tryiing to use a qtip and peroxide, with some reduced muffled hearing as well.  No d/c, blood or mucous.  Was swimming all yesterday for the holiday, really got worse this am.  No high fever or ear swelling. Pt denies chest pain, increased sob or doe, wheezing, orthopnea, PND, increased LE swelling, palpitations, dizziness or syncope.  Pt denies new neurological symptoms such as new headache, or facial or extremity weakness or numbness   Pt denies polydipsia, polyuria No past medical history on file. No past surgical history on file.  reports that he has never smoked. He has never used smokeless tobacco. He reports that he drinks about 12.6 oz of alcohol per week. He reports that he does not use drugs. family history includes Cancer in his paternal grandfather; Glaucoma in his paternal grandmother; Healthy in his maternal grandfather and mother; Heart disease in his father. No Known Allergies Current Outpatient Medications on File Prior to Visit  Medication Sig Dispense Refill  . mupirocin ointment (BACTROBAN) 2 % Apply 1 application topically 2 (two) times daily. 15 g 0   No current facility-administered medications on file prior to visit.    Review of Systems All otherwise neg per pt    Objective:   Physical Exam BP 124/84   Pulse 73   Temp 98.4 F (36.9 C) (Oral)   Ht 5\' 11"  (1.803 m)   Wt 175 lb (79.4 kg)   SpO2 98%   BMI 24.41 kg/m  VS noted, mild ill Constitutional: Pt appears in NAD HENT: Head: NCAT.  Right Ear: External ear normal.  Left Ear: External ear normal. left canal with 1+ red, tender swelling without d/c Bilat tm's with mild erythema.  Max sinus areas non tender.  Pharynx with mild erythema, no exudate Eyes: . Pupils are equal, round, and reactive to light. Conjunctivae and EOM are normal Nose:  without d/c or deformity Neck: Neck supple. Gross normal ROM Cardiovascular: Normal rate and regular rhythm.   Pulmonary/Chest: Effort normal and breath sounds without rales or wheezing.  Neurological: Pt is alert. At baseline orientation, motor grossly intact Skin: Skin is warm. No rashes, other new lesions, no LE edema Psychiatric: Pt behavior is normal without agitation  No other exam findings Lab Results  Component Value Date   WBC 4.5 12/11/2017   HGB 14.4 12/11/2017   HCT 42.2 12/11/2017   PLT 162.0 12/11/2017   GLUCOSE 87 12/11/2017   CHOL 194 12/11/2017   TRIG 124.0 12/11/2017   HDL 57.00 12/11/2017   LDLCALC 112 (H) 12/11/2017   ALT 27 12/11/2017   AST 21 12/11/2017   NA 135 12/11/2017   K 4.2 12/11/2017   CL 97 12/11/2017   CREATININE 0.87 12/11/2017   BUN 11 12/11/2017   CO2 31 12/11/2017   TSH 0.91 12/11/2017       Assessment & Plan:

## 2018-12-13 ENCOUNTER — Ambulatory Visit: Payer: BLUE CROSS/BLUE SHIELD | Admitting: Internal Medicine

## 2019-01-30 ENCOUNTER — Other Ambulatory Visit: Payer: Self-pay

## 2019-01-30 ENCOUNTER — Ambulatory Visit (INDEPENDENT_AMBULATORY_CARE_PROVIDER_SITE_OTHER): Payer: BLUE CROSS/BLUE SHIELD | Admitting: Internal Medicine

## 2019-01-30 ENCOUNTER — Encounter: Payer: Self-pay | Admitting: Internal Medicine

## 2019-01-30 VITALS — BP 126/84 | HR 96 | Temp 98.1°F | Ht 71.0 in | Wt 168.0 lb

## 2019-01-30 DIAGNOSIS — Z0000 Encounter for general adult medical examination without abnormal findings: Secondary | ICD-10-CM

## 2019-01-30 NOTE — Patient Instructions (Signed)
Please continue all other medications as before, and refills have been done if requested.  Please have the pharmacy call with any other refills you may need.  Please continue your efforts at being more active, low cholesterol diet, and weight control.  You are otherwise up to date with prevention measures today.  Please keep your appointments with your specialists as you may have planned  Please return in 1 year for your yearly visit, or sooner if needed, with Lab testing done 3-5 days before  

## 2019-01-30 NOTE — Assessment & Plan Note (Signed)

## 2019-01-30 NOTE — Progress Notes (Signed)
Subjective:    Patient ID: Samuel ParkerChristopher K Neal, male    DOB: [birth year], 33 y.o.   MRN: [id number]  HPI  Here for wellness and f/u;  Overall doing ok;  Pt denies Chest pain, worsening SOB, DOE, wheezing, orthopnea, PND, worsening LE edema, palpitations, dizziness or syncope.  Pt denies neurological change such as new headache, facial or extremity weakness.  Pt denies polydipsia, polyuria, or low sugar symptoms. Pt states overall good compliance with treatment and medications, good tolerability, and has been trying to follow appropriate diet.  Pt denies worsening depressive symptoms, suicidal ideation or panic. No fever, night sweats, wt loss, loss of appetite, or other constitutional symptoms.  Pt states good ability with ADL's, has low fall risk, home safety reviewed and adequate, no other significant changes in hearing or vision, and only occasionally active with exercise.  No new complaints No past medical history on file. No past surgical history on file.  reports that he has never smoked. He has never used smokeless tobacco. He reports current alcohol use of about 21.0 standard drinks of alcohol per week. He reports that he does not use drugs. family history includes Cancer in his paternal grandfather; Glaucoma in his paternal grandmother; Healthy in his maternal grandfather and mother; Heart disease in his father. No Known Allergies Current Outpatient Medications on File Prior to Visit  Medication Sig Dispense Refill  . mupirocin ointment (BACTROBAN) 2 % Apply 1 application topically 2 (two) times daily. 15 g 0   No current facility-administered medications on file prior to visit.    Review of Systems Constitutional: Negative for other unusual diaphoresis, sweats, appetite or weight changes HENT: Negative for other worsening hearing loss, ear pain, facial swelling, mouth sores or neck stiffness.   Eyes: Negative for other worsening pain, redness or other visual disturbance.  Respiratory:  Negative for other stridor or swelling Cardiovascular: Negative for other palpitations or other chest pain  Gastrointestinal: Negative for worsening diarrhea or loose stools, blood in stool, distention or other pain Genitourinary: Negative for hematuria, flank pain or other change in urine volume.  Musculoskeletal: Negative for myalgias or other joint swelling.  Skin: Negative for other color change, or other wound or worsening drainage.  Neurological: Negative for other syncope or numbness. Hematological: Negative for other adenopathy or swelling Psychiatric/Behavioral: Negative for hallucinations, other worsening agitation, SI, self-injury, or new decreased concentration All other system neg per pt    Objective:   Physical Exam Vs VS noted,  Constitutional: Pt is oriented to person, place, and time. Appears well-developed and well-nourished, in no significant distress and comfortable Head: Normocephalic and atraumatic  Eyes: Conjunctivae and EOM are normal. Pupils are equal, round, and reactive to light Right Ear: External ear normal without discharge Left Ear: External ear normal without discharge Nose: Nose without discharge or deformity Mouth/Throat: Oropharynx is without other ulcerations and moist  Neck: Normal range of motion. Neck supple. No JVD present. No tracheal deviation present or significant neck LA or mass Cardiovascular: Normal rate, regular rhythm, normal heart sounds and intact distal pulses.   Pulmonary/Chest: WOB normal and breath sounds without rales or wheezing  Abdominal: Soft. Bowel sounds are normal. NT. No HSM  Musculoskeletal: Normal range of motion. Exhibits no edema Lymphadenopathy: Has no other cervical adenopathy.  Neurological: Pt is alert and oriented to person, place, and time. Pt has normal reflexes. No cranial nerve deficit. Motor grossly intact, Gait intact Skin: Skin is warm and dry. No rash noted or new ulcerations  Psychiatric:  Has normal mood  and affect. Behavior is normal without agitation No other exam findings Lab Results  Component Value Date   WBC 4.5 12/11/2017   HGB 14.4 12/11/2017   HCT 42.2 12/11/2017   PLT 162.0 12/11/2017   GLUCOSE 87 12/11/2017   CHOL 194 12/11/2017   TRIG 124.0 12/11/2017   HDL 57.00 12/11/2017   LDLCALC 112 (H) 12/11/2017   ALT 27 12/11/2017   AST 21 12/11/2017   NA 135 12/11/2017   K 4.2 12/11/2017   CL 97 12/11/2017   CREATININE 0.87 12/11/2017   BUN 11 12/11/2017   CO2 31 12/11/2017   TSH 0.91 12/11/2017        Assessment & Plan:

## 2019-02-26 ENCOUNTER — Ambulatory Visit (INDEPENDENT_AMBULATORY_CARE_PROVIDER_SITE_OTHER): Payer: BC Managed Care – PPO | Admitting: Internal Medicine

## 2019-02-26 ENCOUNTER — Other Ambulatory Visit (INDEPENDENT_AMBULATORY_CARE_PROVIDER_SITE_OTHER): Payer: BC Managed Care – PPO

## 2019-02-26 ENCOUNTER — Encounter: Payer: Self-pay | Admitting: Internal Medicine

## 2019-02-26 ENCOUNTER — Other Ambulatory Visit: Payer: Self-pay

## 2019-02-26 VITALS — BP 144/88 | HR 82 | Temp 97.6°F | Ht 71.0 in | Wt 169.0 lb

## 2019-02-26 DIAGNOSIS — Z0000 Encounter for general adult medical examination without abnormal findings: Secondary | ICD-10-CM

## 2019-02-26 DIAGNOSIS — E785 Hyperlipidemia, unspecified: Secondary | ICD-10-CM | POA: Diagnosis not present

## 2019-02-26 DIAGNOSIS — W57XXXA Bitten or stung by nonvenomous insect and other nonvenomous arthropods, initial encounter: Secondary | ICD-10-CM | POA: Diagnosis not present

## 2019-02-26 DIAGNOSIS — S30860A Insect bite (nonvenomous) of lower back and pelvis, initial encounter: Secondary | ICD-10-CM

## 2019-02-26 LAB — CBC WITH DIFFERENTIAL/PLATELET
Basophils Absolute: 0 10*3/uL (ref 0.0–0.1)
Basophils Relative: 0.7 % (ref 0.0–3.0)
Eosinophils Absolute: 0.1 10*3/uL (ref 0.0–0.7)
Eosinophils Relative: 1 % (ref 0.0–5.0)
HCT: 43.7 % (ref 39.0–52.0)
Hemoglobin: 14.9 g/dL (ref 13.0–17.0)
Lymphocytes Relative: 18.8 % (ref 12.0–46.0)
Lymphs Abs: 1 10*3/uL (ref 0.7–4.0)
MCHC: 34.1 g/dL (ref 30.0–36.0)
MCV: 91.3 fl (ref 78.0–100.0)
Monocytes Absolute: 0.6 10*3/uL (ref 0.1–1.0)
Monocytes Relative: 10.7 % (ref 3.0–12.0)
Neutro Abs: 3.7 10*3/uL (ref 1.4–7.7)
Neutrophils Relative %: 68.8 % (ref 43.0–77.0)
Platelets: 173 10*3/uL (ref 150.0–400.0)
RBC: 4.79 Mil/uL (ref 4.22–5.81)
RDW: 12.6 % (ref 11.5–15.5)
WBC: 5.3 10*3/uL (ref 4.0–10.5)

## 2019-02-26 LAB — HEPATIC FUNCTION PANEL
ALT: 28 U/L (ref 0–53)
AST: 19 U/L (ref 0–37)
Albumin: 5.2 g/dL (ref 3.5–5.2)
Alkaline Phosphatase: 79 U/L (ref 39–117)
Bilirubin, Direct: 0.1 mg/dL (ref 0.0–0.3)
Total Bilirubin: 0.5 mg/dL (ref 0.2–1.2)
Total Protein: 7.5 g/dL (ref 6.0–8.3)

## 2019-02-26 LAB — BASIC METABOLIC PANEL
BUN: 7 mg/dL (ref 6–23)
CO2: 30 mEq/L (ref 19–32)
Calcium: 9.8 mg/dL (ref 8.4–10.5)
Chloride: 92 mEq/L — ABNORMAL LOW (ref 96–112)
Creatinine, Ser: 0.68 mg/dL (ref 0.40–1.50)
GFR: 134.44 mL/min (ref >60.00)
Glucose, Bld: 90 mg/dL (ref 70–99)
Potassium: 3.8 mEq/L (ref 3.5–5.1)
Sodium: 132 mEq/L — ABNORMAL LOW (ref 135–145)

## 2019-02-26 LAB — LIPID PANEL
Cholesterol: 176 mg/dL (ref 0–200)
HDL: 64.4 mg/dL (ref >39.00)
LDL Cholesterol: 97 mg/dL (ref 0–99)
NonHDL: 111.63
Total CHOL/HDL Ratio: 3
Triglycerides: 75 mg/dL (ref 0.0–149.0)
VLDL: 15 mg/dL (ref 0.0–40.0)

## 2019-02-26 LAB — TSH: TSH: 0.8 u[IU]/mL (ref 0.35–4.50)

## 2019-02-26 MED ORDER — DOXYCYCLINE HYCLATE 100 MG PO TABS: 100.0000 mg | ORAL_TABLET | Freq: Two times a day (BID) | ORAL | 0 refills | Status: DC

## 2019-02-26 NOTE — Assessment & Plan Note (Signed)
For lower chol diet,  to f/u any worsening symptoms or concerns 

## 2019-02-26 NOTE — Progress Notes (Signed)
Subjective:    Patient ID: Samuel Neal, male    DOB: [birth year], 33 y.o.   MRN: [id number]  HPI  Here to f/u with LLQ tick bite x 1 wk but now with unusual mild to mod redness, swelling, tender, but no fever, rash or drainage.  Concerned about risk of tick borne disease.  Pt denies chest pain, increased sob or doe, wheezing, orthopnea, PND, increased LE swelling, palpitations, dizziness or syncope.  Pt denies new neurological symptoms such as new headache, or facial or extremity weakness or numbness   Pt denies polydipsia, polyuria Past Medical History:  Diagnosis Date  . HLD (hyperlipidemia) 12/11/2017   No past surgical history on file.  reports that he has never smoked. He has never used smokeless tobacco. He reports current alcohol use of about 21.0 standard drinks of alcohol per week. He reports that he does not use drugs. family history includes Cancer in his paternal grandfather; Glaucoma in his paternal grandmother; Healthy in his maternal grandfather and mother; Heart disease in his father. No Known Allergies Current Outpatient Medications on File Prior to Visit  Medication Sig Dispense Refill  . mupirocin ointment (BACTROBAN) 2 % Apply 1 application topically 2 (two) times daily. 15 g 0   No current facility-administered medications on file prior to visit.    Review of Systems  Constitutional: Negative for other unusual diaphoresis or sweats HENT: Negative for ear discharge or swelling Eyes: Negative for other worsening visual disturbances Respiratory: Negative for stridor or other swelling  Gastrointestinal: Negative for worsening distension or other blood Genitourinary: Negative for retention or other urinary change Musculoskeletal: Negative for other MSK pain or swelling Skin: Negative for color change or other new lesions Neurological: Negative for worsening tremors and other numbness  Psychiatric/Behavioral: Negative for worsening agitation or other fatigue All  other system neg per pt    Objective:   Physical Exam BP (!) 144/88   Pulse 82   Temp 97.6 F (36.4 C) (Oral)   Ht 5\' 11"  (1.803 m)   Wt 169 lb (76.7 kg)   SpO2 99%   BMI 23.57 kg/m  VS noted,  Constitutional: Pt appears in NAD HENT: Head: NCAT.  Right Ear: External ear normal.  Left Ear: External ear normal.  Eyes: . Pupils are equal, round, and reactive to light. Conjunctivae and EOM are normal Nose: without d/c or deformity Neck: Neck supple. Gross normal ROM Cardiovascular: Normal rate and regular rhythm.   Pulmonary/Chest: Effort normal and breath sounds without rales or wheezing.  Abd:  Soft, NT, ND, + BS, no organomegaly Neurological: Pt is alert. At baseline orientation, motor grossly intact Skin: Skin is warm. no LE edema,LLQ area with 1.5 cm area slightly raised red, tender, swelling and central ? Pustule, no drainage or red streaks Psychiatric: Pt behavior is normal without agitation  No other exam findings Lab Results  Component Value Date   WBC 5.3 02/26/2019   HGB 14.9 02/26/2019   HCT 43.7 02/26/2019   PLT 173.0 02/26/2019   GLUCOSE 90 02/26/2019   CHOL 176 02/26/2019   TRIG 75.0 02/26/2019   HDL 64.40 02/26/2019   LDLCALC 97 02/26/2019   ALT 28 02/26/2019   AST 19 02/26/2019   NA 132 (L) 02/26/2019   K 3.8 02/26/2019   CL 92 (L) 02/26/2019   CREATININE 0.68 02/26/2019   BUN 7 02/26/2019   CO2 30 02/26/2019   TSH 0.80 02/26/2019       Assessment & Plan:

## 2019-02-26 NOTE — Patient Instructions (Signed)
Please take all new medication as prescribed - the antibiotic  Please continue all other medications as before, and refills have been done if requested.  Please have the pharmacy call with any other refills you may need.  Please continue your efforts at being more active, low cholesterol diet, and weight control.  You are otherwise up to date with prevention measures today.  Please keep your appointments with your specialists as you may have planned  Please go to the LAB in the Basement (turn left off the elevator) for the tests to be done today  You will be contacted by phone if any changes need to be made immediately.  Otherwise, you will receive a letter about your results with an explanation, but please check with MyChart first.  Please remember to sign up for MyChart if you have not done so, as this will be important to you in the future with finding out test results, communicating by private email, and scheduling acute appointments online when needed.  Please return in 1 year for your yearly visit, or sooner if needed 

## 2019-02-26 NOTE — Assessment & Plan Note (Signed)
Mild to mod, for antibx course,  to f/u any worsening symptoms or concerns, for tick borne dz labs

## 2019-03-13 ENCOUNTER — Ambulatory Visit: Payer: Self-pay | Admitting: *Deleted

## 2019-03-13 DIAGNOSIS — Z20822 Contact with and (suspected) exposure to covid-19: Secondary | ICD-10-CM

## 2019-03-13 NOTE — Telephone Encounter (Signed)
Scheduled patient for COVID 19 testing 03/14/2019 at 8:45 am.  Testing protocol reviewed.

## 2019-03-13 NOTE — Addendum Note (Signed)
Addended by: Denyce Robert on: 03/13/2019 02:12 PM   Modules accepted: Orders

## 2019-03-13 NOTE — Telephone Encounter (Signed)
Ok to refer for testing 

## 2019-03-13 NOTE — Telephone Encounter (Signed)
Please contact pt to schedule for covid testing per PCP. Thanks! 

## 2019-03-13 NOTE — Telephone Encounter (Signed)
Pt calling to request to be tested for covid-19. Stated that he is not having symptoms but he was in contact with his cousin about 10 days ago and a couple of days she tested positive for the virus. He also states his wife is having some respiratory symptoms and she did have a virtual video with her provider this morning. Notified [facility] at [facility] for an appointment. Advised to send the triage to the office and will contact patient. Informed pt that he will receive a call back, he voiced understanding. Routing to the practice for review and recommendation.  Reason for Disposition . [1] COVID-19 EXPOSURE (Close Contact) AND [2] within last 14 days BUT [3] NO symptoms  Answer Assessment - Initial Assessment Questions 1. CLOSE CONTACT: "Who is the person with the confirmed or suspected COVID-19 infection that you were exposed to?"     cousin 2. PLACE of CONTACT: "Where were you when you were exposed to COVID-19?" (e.g., home, school, medical waiting room; which city?)     In the car and outside 3. TYPE of CONTACT: "How much contact was there?" (e.g., sitting next to, live in same house, work in same office, same building)     Sitting next to him 4. DURATION of CONTACT: "How long were you in contact with the COVID-19 patient?" (e.g., a few seconds, passed by person, a few minutes, live with the patient)     About 2 hours 5. DATE of CONTACT: "When did you have contact with a COVID-19 patient?" (e.g., how many days ago)     June 20 th 6. TRAVEL: "Have you traveled out of the country recently?" If so, "When and where?"     * Also ask about out-of-state travel, since the CDC has identified some high-risk cities for community spread in the [place].     * Note: Travel becomes less relevant if there is widespread community transmission where the patient lives.     no 7. COMMUNITY SPREAD: "Are there lots of cases of COVID-19 (community spread) where you live?" (See public health department website, if unsure)        In the community 8. SYMPTOMS: "Do you have any symptoms?" (e.g., fever, cough, breathing difficulty)     no 9. PREGNANCY OR POSTPARTUM: "Is there any chance you are pregnant?" "When was your last menstrual period?" "Did you deliver in the last 2 weeks?"     no 10. HIGH RISK: "Do you have any heart or lung problems? Do you have a weak immune system?" (e.g., CHF, COPD, asthma, HIV positive, chemotherapy, renal failure, diabetes mellitus, sickle cell anemia)       no  Protocols used: CORONAVIRUS (COVID-19) EXPOSURE-A-AH

## 2019-03-14 ENCOUNTER — Other Ambulatory Visit: Payer: BC Managed Care – PPO

## 2019-03-14 DIAGNOSIS — Z20822 Contact with and (suspected) exposure to covid-19: Secondary | ICD-10-CM

## 2019-03-19 ENCOUNTER — Telehealth: Payer: Self-pay

## 2019-03-19 LAB — NOVEL CORONAVIRUS, NAA: SARS-CoV-2, NAA: NOT DETECTED

## 2019-03-19 NOTE — Telephone Encounter (Signed)
-----   Message from Biagio Borg, MD sent at 03/19/2019  6:34 AM EDT ----- Left message on MyChart, pt to cont same tx  Laith Antonelli to please inform pt, COVID is negative

## 2019-03-19 NOTE — Telephone Encounter (Signed)
Pt has viewed results via MyChart  

## 2020-01-31 ENCOUNTER — Other Ambulatory Visit: Payer: Self-pay

## 2020-01-31 ENCOUNTER — Encounter: Payer: Self-pay | Admitting: Internal Medicine

## 2020-01-31 ENCOUNTER — Ambulatory Visit (INDEPENDENT_AMBULATORY_CARE_PROVIDER_SITE_OTHER): Payer: BC Managed Care – PPO | Admitting: Internal Medicine

## 2020-01-31 VITALS — BP 140/86 | HR 82 | Temp 98.5°F | Ht 71.0 in | Wt 166.0 lb

## 2020-01-31 DIAGNOSIS — Z0000 Encounter for general adult medical examination without abnormal findings: Secondary | ICD-10-CM | POA: Diagnosis not present

## 2020-01-31 LAB — CBC WITH DIFFERENTIAL/PLATELET
Basophils Absolute: 0 10*3/uL (ref 0.0–0.1)
Basophils Relative: 0.8 % (ref 0.0–3.0)
Eosinophils Absolute: 0.1 10*3/uL (ref 0.0–0.7)
Eosinophils Relative: 1.1 % (ref 0.0–5.0)
HCT: 42.7 % (ref 39.0–52.0)
Hemoglobin: 14.7 g/dL (ref 13.0–17.0)
Lymphocytes Relative: 24.2 % (ref 12.0–46.0)
Lymphs Abs: 1.2 10*3/uL (ref 0.7–4.0)
MCHC: 34.6 g/dL (ref 30.0–36.0)
MCV: 90.4 fl (ref 78.0–100.0)
Monocytes Absolute: 0.5 10*3/uL (ref 0.1–1.0)
Monocytes Relative: 10.3 % (ref 3.0–12.0)
Neutro Abs: 3.1 10*3/uL (ref 1.4–7.7)
Neutrophils Relative %: 63.6 % (ref 43.0–77.0)
Platelets: 154 10*3/uL (ref 150.0–400.0)
RBC: 4.72 Mil/uL (ref 4.22–5.81)
RDW: 12.2 % (ref 11.5–15.5)
WBC: 4.9 10*3/uL (ref 4.0–10.5)

## 2020-01-31 LAB — HEPATIC FUNCTION PANEL
ALT: 70 U/L — ABNORMAL HIGH (ref 0–53)
AST: 42 U/L — ABNORMAL HIGH (ref 0–37)
Albumin: 5 g/dL (ref 3.5–5.2)
Alkaline Phosphatase: 84 U/L (ref 39–117)
Bilirubin, Direct: 0.1 mg/dL (ref 0.0–0.3)
Total Bilirubin: 0.5 mg/dL (ref 0.2–1.2)
Total Protein: 7.5 g/dL (ref 6.0–8.3)

## 2020-01-31 LAB — BASIC METABOLIC PANEL
BUN: 10 mg/dL (ref 6–23)
CO2: 30 mEq/L (ref 19–32)
Calcium: 9.7 mg/dL (ref 8.4–10.5)
Chloride: 96 mEq/L (ref 96–112)
Creatinine, Ser: 0.75 mg/dL (ref 0.40–1.50)
GFR: 119.39 mL/min (ref >60.00)
Glucose, Bld: 98 mg/dL (ref 70–99)
Potassium: 3.5 mEq/L (ref 3.5–5.1)
Sodium: 131 mEq/L — ABNORMAL LOW (ref 135–145)

## 2020-01-31 LAB — LIPID PANEL
Cholesterol: 211 mg/dL — ABNORMAL HIGH (ref 0–200)
HDL: 49.8 mg/dL (ref >39.00)
NonHDL: 161.37
Total CHOL/HDL Ratio: 4
Triglycerides: 234 mg/dL — ABNORMAL HIGH (ref 0.0–149.0)
VLDL: 46.8 mg/dL — ABNORMAL HIGH (ref 0.0–40.0)

## 2020-01-31 LAB — LDL CHOLESTEROL, DIRECT: Direct LDL: 134 mg/dL

## 2020-01-31 LAB — TSH: TSH: 0.6 u[IU]/mL (ref 0.35–4.50)

## 2020-01-31 NOTE — Patient Instructions (Signed)

## 2020-01-31 NOTE — Progress Notes (Addendum)
   Subjective:    Patient ID: Samuel Neal, male    DOB: Mar 09, 1986, 34 y.o.   MRN: [id number]  HPI  Here for wellness and f/u;  Overall doing ok;  Pt denies Chest pain, worsening SOB, DOE, wheezing, orthopnea, PND, worsening LE edema, palpitations, dizziness or syncope.  Pt denies neurological change such as new headache, facial or extremity weakness.  Pt denies polydipsia, polyuria, or low sugar symptoms. Pt states overall good compliance with treatment and medications, good tolerability, and has been trying to follow appropriate diet.  Pt denies worsening depressive symptoms, suicidal ideation or panic. No fever, night sweats, wt loss, loss of appetite, or other constitutional symptoms.  Pt states good ability with ADL's, has low fall risk, home safety reviewed and adequate, no other significant changes in hearing or vision, and only occasionally active with exercise.  No new complaints Past Medical History:  Diagnosis Date  . HLD (hyperlipidemia) 12/11/2017   History reviewed. No pertinent surgical history.  reports that he has never smoked. He has never used smokeless tobacco. He reports current alcohol use of about 21.0 standard drinks of alcohol per week. He reports that he does not use drugs. family history includes Cancer in his paternal grandfather; Glaucoma in his paternal grandmother; Healthy in his maternal grandfather and mother; Heart disease in his father. No Known Allergies No current outpatient medications on file prior to visit.   No current facility-administered medications on file prior to visit.   Review of Systems All otherwise neg per pt     Objective:   Physical Exam BP 140/86 (BP Location: Left Arm, Patient Position: Sitting, Cuff Size: Large)   Pulse 82   Temp 98.5 F (36.9 C) (Oral)   Ht 5\' 11"  (1.803 m)   Wt 166 lb (75.3 kg)   SpO2 99%   BMI 23.15 kg/m  VS noted,  Constitutional: Pt appears in NAD HENT: Head: NCAT.  Right Ear: External ear normal.    Left Ear: External ear normal.  Eyes: . Pupils are equal, round, and reactive to light. Conjunctivae and EOM are normal Nose: without d/c or deformity Neck: Neck supple. Gross normal ROM Cardiovascular: Normal rate and regular rhythm.   Pulmonary/Chest: Effort normal and breath sounds without rales or wheezing.  Abd:  Soft, NT, ND, + BS, no organomegaly Neurological: Pt is alert. At baseline orientation, motor grossly intact Skin: Skin is warm. No rashes, other new lesions, no LE edema Psychiatric: Pt behavior is normal without agitation  All otherwise neg per pt Lab Results  Component Value Date   WBC 4.9 01/31/2020   HGB 14.7 01/31/2020   HCT 42.7 01/31/2020   PLT 154.0 01/31/2020   GLUCOSE 98 01/31/2020   CHOL 211 (H) 01/31/2020   TRIG 234.0 (H) 01/31/2020   HDL 49.80 01/31/2020   LDLDIRECT 134.0 01/31/2020   LDLCALC 97 02/26/2019   ALT 70 (H) 01/31/2020   AST 42 (H) 01/31/2020   NA 131 (L) 01/31/2020   K 3.5 01/31/2020   CL 96 01/31/2020   CREATININE 0.75 01/31/2020   BUN 10 01/31/2020   CO2 30 01/31/2020   TSH 0.60 01/31/2020          Assessment & Plan:

## 2020-02-01 ENCOUNTER — Encounter: Payer: Self-pay | Admitting: Internal Medicine

## 2020-02-01 NOTE — Assessment & Plan Note (Signed)

## 2020-04-03 ENCOUNTER — Encounter: Payer: Self-pay | Admitting: Internal Medicine

## 2020-04-03 ENCOUNTER — Ambulatory Visit (INDEPENDENT_AMBULATORY_CARE_PROVIDER_SITE_OTHER): Payer: BC Managed Care – PPO | Admitting: Internal Medicine

## 2020-04-03 ENCOUNTER — Other Ambulatory Visit: Payer: Self-pay

## 2020-04-03 DIAGNOSIS — R21 Rash and other nonspecific skin eruption: Secondary | ICD-10-CM | POA: Diagnosis not present

## 2020-04-03 DIAGNOSIS — E785 Hyperlipidemia, unspecified: Secondary | ICD-10-CM

## 2020-04-03 MED ORDER — DOXYCYCLINE HYCLATE 100 MG PO TABS
100.0000 mg | ORAL_TABLET | Freq: Two times a day (BID) | ORAL | 0 refills | Status: DC
Start: 2020-04-03 — End: 2021-04-21

## 2020-04-03 MED ORDER — TRIAMCINOLONE ACETONIDE 0.1 % EX CREA: 1.0000 "application " | TOPICAL_CREAM | Freq: Two times a day (BID) | CUTANEOUS | 1 refills | Status: AC

## 2020-04-03 NOTE — Progress Notes (Signed)
   Subjective:    Patient ID: Samuel Neal, male    DOB: 1986/09/10, 34 y.o.   MRN: [id number]  HPI  Here with 2-3 days onset mild to mod gradually worsening left lateral knee red, tender swelling he though initialy might be insect related but today not so sure, but at least does not otherwise feel bad and denies fever, chills, red streaks or trauma.  Pt denies chest pain, increased sob or doe, wheezing, orthopnea, PND, increased LE swelling, palpitations, dizziness or syncope.  Pt denies new neurological symptoms such as new headache, or facial or extremity weakness or numbness   Pt denies polydipsia, polyuria.   Past Medical History:  Diagnosis Date  . HLD (hyperlipidemia) 12/11/2017   History reviewed. No pertinent surgical history.  reports that he has never smoked. He has never used smokeless tobacco. He reports current alcohol use of about 21.0 standard drinks of alcohol per week. He reports that he does not use drugs. family history includes Cancer in his paternal grandfather; Glaucoma in his paternal grandmother; Healthy in his maternal grandfather and mother; Heart disease in his father. No Known Allergies No current outpatient medications on file prior to visit.   No current facility-administered medications on file prior to visit.   Review of Systems All otherwise neg per pt     Objective:   Physical Exam BP (!) 140/96 (BP Location: Left Arm, Patient Position: Sitting, Cuff Size: Large)   Pulse 87   Temp 98.1 F (36.7 C) (Oral)   Ht 5\' 11"  (1.803 m)   Wt 166 lb (75.3 kg)   SpO2 99%   BMI 23.15 kg/m  VS noted,  Constitutional: Pt appears in NAD HENT: Head: NCAT.  Right Ear: External ear normal.  Left Ear: External ear normal.  Eyes: . Pupils are equal, round, and reactive to light. Conjunctivae and EOM are normal Nose: without d/c or deformity Neck: Neck supple. Gross normal ROM Cardiovascular: Normal rate and regular rhythm.   Pulmonary/Chest: Effort normal and  breath sounds without rales or wheezing.  Left letarl knee with 4 x 6 cm area mild erythema and swelling mild tender, no red streaks flutuance Neurological: Pt is alert. At baseline orientation, motor grossly intact Skin: Skin is warm. No other new lesions, no LE edema Psychiatric: Pt behavior is normal without agitation  All otherwise neg per pt Lab Results  Component Value Date   WBC 4.9 01/31/2020   HGB 14.7 01/31/2020   HCT 42.7 01/31/2020   PLT 154.0 01/31/2020   GLUCOSE 98 01/31/2020   CHOL 211 (H) 01/31/2020   TRIG 234.0 (H) 01/31/2020   HDL 49.80 01/31/2020   LDLDIRECT 134.0 01/31/2020   LDLCALC 97 02/26/2019   ALT 70 (H) 01/31/2020   AST 42 (H) 01/31/2020   NA 131 (L) 01/31/2020   K 3.5 01/31/2020   CL 96 01/31/2020   CREATININE 0.75 01/31/2020   BUN 10 01/31/2020   CO2 30 01/31/2020   TSH 0.60 01/31/2020      Assessment & Plan:

## 2020-04-03 NOTE — Patient Instructions (Signed)
Please take all new medication as prescribed - the steroid cream, as well as the antibiotic if you have fever  Please continue all other medications as before, and refills have been done if requested.  Please have the pharmacy call with any other refills you may need.  Please continue your efforts at being more active, low cholesterol diet, and weight control.  Please keep your appointments with your specialists as you may have planned

## 2020-04-04 ENCOUNTER — Encounter: Payer: Self-pay | Admitting: Internal Medicine

## 2020-04-04 NOTE — Assessment & Plan Note (Addendum)
Etiology unclear, suspect dermatitis but cant r/o cellulitis, for triam cr, but also for doxy course if worsens or have fever, chills  I spent 21 minutes in preparing to see the patient by review of recent labs, imaging and procedures, obtaining and reviewing separately obtained history, communicating with the patient and family or caregiver, ordering medications, tests or procedures, and documenting clinical information in the EHR including the differential Dx, treatment, and any further evaluation and other management of rash, hld

## 2020-04-04 NOTE — Assessment & Plan Note (Signed)
stable overall by history and exam, recent data reviewed with pt, and pt to continue medical treatment as before,  to f/u any worsening symptoms or concerns  

## 2020-04-06 ENCOUNTER — Encounter: Payer: Self-pay | Admitting: Internal Medicine

## 2021-04-21 ENCOUNTER — Encounter: Payer: Self-pay | Admitting: Emergency Medicine

## 2021-04-21 ENCOUNTER — Ambulatory Visit (INDEPENDENT_AMBULATORY_CARE_PROVIDER_SITE_OTHER): Payer: BC Managed Care – PPO | Admitting: Emergency Medicine

## 2021-04-21 ENCOUNTER — Other Ambulatory Visit: Payer: Self-pay

## 2021-04-21 VITALS — BP 130/80 | HR 89 | Temp 98.3°F | Ht 71.0 in | Wt 175.0 lb

## 2021-04-21 DIAGNOSIS — B354 Tinea corporis: Secondary | ICD-10-CM | POA: Diagnosis not present

## 2021-04-21 MED ORDER — CLOTRIMAZOLE-BETAMETHASONE 1-0.05 % EX CREA: 1.0000 "application " | TOPICAL_CREAM | Freq: Two times a day (BID) | CUTANEOUS | 1 refills | Status: DC

## 2021-04-21 NOTE — Patient Instructions (Signed)
Body Ringworm Body ringworm is an infection of the skin that often causes a ring-shaped rash. Body ringworm is also called tinea corporis. Body ringworm can affect any part of your skin. This condition is easily spread from person to person (is very contagious). What are the causes? This condition is caused by fungi called dermatophytes. The condition develops when these fungi grow out of control on the skin. You can get this condition if you touch a person or animal that has it. You can also get it if you share any items with an infected person or pet. These include: Clothing, bedding, and towels. Brushes or combs. Gym equipment. Any other object that has the fungus on it. What increases the risk? You are more likely to develop this condition if you: Play sports that involve close physical contact, such as wrestling. Sweat a lot. Live in areas that are hot and humid. Use public showers. Have a weakened immune system. What are the signs or symptoms? Symptoms of this condition include: Itchy, raised red spots and bumps. Red scaly patches. A ring-shaped rash. The rash may have: A clear center. Scales or red bumps at its center. Redness near its borders. Dry and scaly skin on or around it. How is this diagnosed? This condition can usually be diagnosed with a skin exam. A skin scraping may be taken from the affected area and examined under a microscope to see if the fungus is present. How is this treated? This condition may be treated with: An antifungal cream or ointment. An antifungal shampoo. Antifungal medicines. These may be prescribed if your ringworm: Is severe. Keeps coming back. Lasts a long time. Follow these instructions at home: Take over-the-counter and prescription medicines only as told by your health care provider. If you were given an antifungal cream or ointment: Use it as told by your health care provider. Wash the infected area and dry it completely before  applying the cream or ointment. If you were given an antifungal shampoo: Use it as told by your health care provider. Leave the shampoo on your body for 3-5 minutes before rinsing. While you have a rash: Wear loose clothing to stop clothes from rubbing and irritating it. Wash or change your bed sheets every night. Disinfect or throw out items that may be infected. Wash clothes and bed sheets in hot water. Wash your hands often with soap and water. If soap and water are not available, use hand sanitizer. If your pet has the same infection, take your pet to see a veterinarian for treatment. How is this prevented? Take a bath or shower every day and after every time you work out or play sports. Dry your skin completely after bathing. Wear sandals or shoes in public places and showers. Change your clothes every day. Wash athletic clothes after each use. Do not share personal items with others. Avoid touching red patches of skin on other people. Avoid touching pets that have bald spots. If you touch an animal that has a bald spot, wash your hands. Contact a health care provider if: Your rash continues to spread after 7 days of treatment. Your rash is not gone in 4 weeks. The area around your rash gets red, warm, tender, and swollen. Summary Body ringworm is an infection of the skin that often causes a ring-shaped rash. This condition is easily spread from person to person (is very contagious). This condition may be treated with antifungal cream or ointment, antifungal shampoo, or antifungal medicines. Take over-the-counter and   prescription medicines only as told by your health care provider. This information is not intended to replace advice given to you by your health care provider. Make sure you discuss any questions you have with your health care provider. Document Revised: 04/27/2018 Document Reviewed: 04/27/2018 Elsevier Patient Education  2022 Elsevier Inc.  

## 2021-04-21 NOTE — Progress Notes (Signed)
Samuel Neal 35 y.o.   Chief Complaint  Patient presents with   Rash    Possible tick bite x 3wks, pt has a red, raise rash on his butt.    HISTORY OF PRESENT ILLNESS: This is a 35 y.o. male complaining of itchy rash to right inner buttock area for the past 3 weeks.  Concerned about tick bite. No other complaints or medical concerns today.  Rash Pertinent negatives include no congestion, cough, diarrhea, fever, shortness of breath, sore throat or vomiting.    Prior to Admission medications   Medication Sig Start Date End Date Taking? Authorizing Provider  doxycycline (VIBRA-TABS) 100 MG tablet Take 1 tablet (100 mg total) by mouth 2 (two) times daily. 04/03/20   Corwin Levins, MD    No Known Allergies  Patient Active Problem List   Diagnosis Date Noted   Rash 04/03/2020   Tick bite, infected 02/26/2019   HLD (hyperlipidemia) 12/11/2017   Routine general medical examination at a health care facility 04/21/2016    Past Medical History:  Diagnosis Date   HLD (hyperlipidemia) 12/11/2017    No past surgical history on file.  Social History   Socioeconomic History   Marital status: Married    Spouse name: Not on file   Number of children: 0   Years of education: 16   Highest education level: Not on file  Occupational History   Occupation: Sales  Tobacco Use   Smoking status: Never   Smokeless tobacco: Never  Substance and Sexual Activity   Alcohol use: Yes    Alcohol/week: 21.0 standard drinks    Types: 21 Cans of beer per week   Drug use: No   Sexual activity: Not on file  Other Topics Concern   Not on file  Social History Narrative   Fun: Mountain bike, motor cycles   Social Determinants of Health   Financial Resource Strain: Not on file  Food Insecurity: Not on file  Transportation Needs: Not on file  Physical Activity: Not on file  Stress: Not on file  Social Connections: Not on file  Intimate Partner Violence: Not on file    Family History   Problem Relation Age of Onset   Healthy Mother    Heart disease Father    Healthy Maternal Grandfather    Glaucoma Paternal Grandmother    Cancer Paternal Grandfather      Review of Systems  Constitutional: Negative.  Negative for chills and fever.  HENT: Negative.  Negative for congestion and sore throat.   Respiratory: Negative.  Negative for cough and shortness of breath.   Gastrointestinal:  Negative for abdominal pain, diarrhea, nausea and vomiting.  Skin:  Positive for itching and rash.  Neurological:  Negative for dizziness and headaches.  All other systems reviewed and are negative. Today's Vitals   04/21/21 1434  BP: (!) 170/80  Pulse: 89  Temp: 98.3 F (36.8 C)  TempSrc: Oral  SpO2: 98%  Weight: 175 lb (79.4 kg)  Height: 5\' 11"  (1.803 m)   Body mass index is 24.41 kg/m.   Physical Exam Constitutional:      Appearance: Normal appearance.  HENT:     Head: Normocephalic.  Eyes:     Extraocular Movements: Extraocular movements intact.     Pupils: Pupils are equal, round, and reactive to light.  Cardiovascular:     Rate and Rhythm: Normal rate.  Pulmonary:     Effort: Pulmonary effort is normal.  Musculoskeletal:  Cervical back: Normal range of motion.  Skin:    Findings: Rash present.     Comments: Round erythematous well demarcated rash to right posterior inner buttock area compatible with ringworm  Neurological:     General: No focal deficit present.     Mental Status: He is alert and oriented to person, place, and time.  Psychiatric:        Mood and Affect: Mood normal.        Behavior: Behavior normal.     ASSESSMENT & PLAN: Samuel Neal was seen today for rash.  Diagnoses and all orders for this visit:  Ringworm, body -     clotrimazole-betamethasone (LOTRISONE) cream; Apply 1 application topically 2 (two) times daily.  Patient Instructions  Body Ringworm Body ringworm is an infection of the skin that often causes a ring-shaped  rash.Body ringworm is also called tinea corporis. Body ringworm can affect any part of your skin. This condition is easily spread from person to person (is very contagious). What are the causes? This condition is caused by fungi called dermatophytes. The condition developswhen these fungi grow out of control on the skin. You can get this condition if you touch a person or animal that has it. You can also get it if you share any items with an infected person or pet. These include: Clothing, bedding, and towels. Brushes or combs. Gym equipment. Any other object that has the fungus on it. What increases the risk? You are more likely to develop this condition if you: Play sports that involve close physical contact, such as wrestling. Sweat a lot. Live in areas that are hot and humid. Use public showers. Have a weakened immune system. What are the signs or symptoms? Symptoms of this condition include: Itchy, raised red spots and bumps. Red scaly patches. A ring-shaped rash. The rash may have: A clear center. Scales or red bumps at its center. Redness near its borders. Dry and scaly skin on or around it. How is this diagnosed? This condition can usually be diagnosed with a skin exam. A skin scraping may be taken from the affected area and examined under a microscope to see if thefungus is present. How is this treated? This condition may be treated with: An antifungal cream or ointment. An antifungal shampoo. Antifungal medicines. These may be prescribed if your ringworm: Is severe. Keeps coming back. Lasts a long time. Follow these instructions at home: Take over-the-counter and prescription medicines only as told by your health care provider. If you were given an antifungal cream or ointment: Use it as told by your health care provider. Wash the infected area and dry it completely before applying the cream or ointment. If you were given an antifungal shampoo: Use it as told by  your health care provider. Leave the shampoo on your body for 3-5 minutes before rinsing. While you have a rash: Wear loose clothing to stop clothes from rubbing and irritating it. Wash or change your bed sheets every night. Disinfect or throw out items that may be infected. Wash clothes and bed sheets in hot water. Wash your hands often with soap and water. If soap and water are not available, use hand sanitizer. If your pet has the same infection, take your pet to see a veterinarian for treatment. How is this prevented? Take a bath or shower every day and after every time you work out or play sports. Dry your skin completely after bathing. Wear sandals or shoes in public places and showers. Change  your clothes every day. Wash athletic clothes after each use. Do not share personal items with others. Avoid touching red patches of skin on other people. Avoid touching pets that have bald spots. If you touch an animal that has a bald spot, wash your hands. Contact a health care provider if: Your rash continues to spread after 7 days of treatment. Your rash is not gone in 4 weeks. The area around your rash gets red, warm, tender, and swollen. Summary Body ringworm is an infection of the skin that often causes a ring-shaped rash. This condition is easily spread from person to person (is very contagious). This condition may be treated with antifungal cream or ointment, antifungal shampoo, or antifungal medicines. Take over-the-counter and prescription medicines only as told by your health care provider. This information is not intended to replace advice given to you by your health care provider. Make sure you discuss any questions you have with your healthcare provider. Document Revised: 04/27/2018 Document Reviewed: 04/27/2018 Elsevier Patient Education  2022 Elsevier Inc.    Edwina Barth, MD [facility]

## 2022-03-22 ENCOUNTER — Encounter: Payer: Self-pay | Admitting: Internal Medicine

## 2022-03-30 ENCOUNTER — Ambulatory Visit (INDEPENDENT_AMBULATORY_CARE_PROVIDER_SITE_OTHER): Payer: BC Managed Care – PPO | Admitting: Internal Medicine

## 2022-03-30 ENCOUNTER — Encounter: Payer: Self-pay | Admitting: Internal Medicine

## 2022-03-30 VITALS — BP 154/98 | HR 70 | Temp 98.6°F | Ht 71.0 in | Wt 176.0 lb

## 2022-03-30 DIAGNOSIS — E559 Vitamin D deficiency, unspecified: Secondary | ICD-10-CM

## 2022-03-30 DIAGNOSIS — J309 Allergic rhinitis, unspecified: Secondary | ICD-10-CM

## 2022-03-30 DIAGNOSIS — Z0001 Encounter for general adult medical examination with abnormal findings: Secondary | ICD-10-CM | POA: Diagnosis not present

## 2022-03-30 DIAGNOSIS — F1010 Alcohol abuse, uncomplicated: Secondary | ICD-10-CM

## 2022-03-30 DIAGNOSIS — R030 Elevated blood-pressure reading, without diagnosis of hypertension: Secondary | ICD-10-CM

## 2022-03-30 DIAGNOSIS — Z1159 Encounter for screening for other viral diseases: Secondary | ICD-10-CM | POA: Diagnosis not present

## 2022-03-30 DIAGNOSIS — E7800 Pure hypercholesterolemia, unspecified: Secondary | ICD-10-CM | POA: Diagnosis not present

## 2022-03-30 DIAGNOSIS — E538 Deficiency of other specified B group vitamins: Secondary | ICD-10-CM

## 2022-03-30 LAB — HEPATIC FUNCTION PANEL
ALT: 52 U/L (ref 0–53)
AST: 35 U/L (ref 0–37)
Albumin: 4.9 g/dL (ref 3.5–5.2)
Alkaline Phosphatase: 76 U/L (ref 39–117)
Bilirubin, Direct: 0.1 mg/dL (ref 0.0–0.3)
Total Bilirubin: 0.5 mg/dL (ref 0.2–1.2)
Total Protein: 7.5 g/dL (ref 6.0–8.3)

## 2022-03-30 LAB — URINALYSIS, ROUTINE W REFLEX MICROSCOPIC
Bilirubin Urine: NEGATIVE
Hgb urine dipstick: NEGATIVE
Ketones, ur: NEGATIVE
Leukocytes,Ua: NEGATIVE
Nitrite: NEGATIVE
RBC / HPF: NONE SEEN (ref 0–?)
Specific Gravity, Urine: <=1.005 — AB (ref 1.000–1.030)
Total Protein, Urine: NEGATIVE
Urine Glucose: NEGATIVE
Urobilinogen, UA: 0.2 (ref 0.0–1.0)
WBC, UA: NONE SEEN (ref 0–?)
pH: 7 (ref 5.0–8.0)

## 2022-03-30 LAB — VITAMIN B12: Vitamin B-12: 378 pg/mL (ref 211–911)

## 2022-03-30 LAB — LIPID PANEL
Cholesterol: 209 mg/dL — ABNORMAL HIGH (ref 0–200)
HDL: 49.5 mg/dL (ref >39.00)
NonHDL: 159.35
Total CHOL/HDL Ratio: 4
Triglycerides: 232 mg/dL — ABNORMAL HIGH (ref 0.0–149.0)
VLDL: 46.4 mg/dL — ABNORMAL HIGH (ref 0.0–40.0)

## 2022-03-30 LAB — CBC WITH DIFFERENTIAL/PLATELET
Basophils Absolute: 0 10*3/uL (ref 0.0–0.1)
Basophils Relative: 0.7 % (ref 0.0–3.0)
Eosinophils Absolute: 0.1 10*3/uL (ref 0.0–0.7)
Eosinophils Relative: 1.7 % (ref 0.0–5.0)
HCT: 42.8 % (ref 39.0–52.0)
Hemoglobin: 14.5 g/dL (ref 13.0–17.0)
Lymphocytes Relative: 24.4 % (ref 12.0–46.0)
Lymphs Abs: 1.3 10*3/uL (ref 0.7–4.0)
MCHC: 33.9 g/dL (ref 30.0–36.0)
MCV: 90.9 fl (ref 78.0–100.0)
Monocytes Absolute: 0.5 10*3/uL (ref 0.1–1.0)
Monocytes Relative: 9.8 % (ref 3.0–12.0)
Neutro Abs: 3.4 10*3/uL (ref 1.4–7.7)
Neutrophils Relative %: 63.4 % (ref 43.0–77.0)
Platelets: 181 10*3/uL (ref 150.0–400.0)
RBC: 4.71 Mil/uL (ref 4.22–5.81)
RDW: 12.4 % (ref 11.5–15.5)
WBC: 5.4 10*3/uL (ref 4.0–10.5)

## 2022-03-30 LAB — TSH: TSH: 0.54 u[IU]/mL (ref 0.35–5.50)

## 2022-03-30 LAB — BASIC METABOLIC PANEL
BUN: 12 mg/dL (ref 6–23)
CO2: 31 mEq/L (ref 19–32)
Calcium: 9.8 mg/dL (ref 8.4–10.5)
Chloride: 99 mEq/L (ref 96–112)
Creatinine, Ser: 0.83 mg/dL (ref 0.40–1.50)
GFR: 113.06 mL/min (ref >60.00)
Glucose, Bld: 89 mg/dL (ref 70–99)
Potassium: 3.9 mEq/L (ref 3.5–5.1)
Sodium: 137 mEq/L (ref 135–145)

## 2022-03-30 LAB — VITAMIN D 25 HYDROXY (VIT D DEFICIENCY, FRACTURES): VITD: 33.28 ng/mL (ref 30.00–100.00)

## 2022-03-30 LAB — LDL CHOLESTEROL, DIRECT: Direct LDL: 135 mg/dL

## 2022-03-30 NOTE — Progress Notes (Signed)
Patient ID: Samuel Neal, male   DOB: [birth year], 36 y.o.   MRN: [id number]         Chief Complaint:: wellness exam and elevated BP, hld, allergies and right ear fullness, alcohol abuse, snoring       HPI:  Samuel Neal is a 36 y.o. male here for wellness exam; due for hep c screen; o/w up to date                        Also had right ear muffling with swimming in [place], resolved but now intermittent with and without swimming. Does have several wks ongoing nasal allergy symptoms with clearish congestion, itch and sneezing, without fever, pain, ST, cough, swelling or wheezing.  Drinks 6 beers per day.  BP at home has been < 140/90 but not checked recently.  Also has had recent worsening snoring, declines ENT referral for now.      Wt Readings from Last 3 Encounters:  03/30/22 176 lb (79.8 kg)  04/21/21 175 lb (79.4 kg)  04/03/20 166 lb (75.3 kg)   BP Readings from Last 3 Encounters:  03/30/22 (!) 154/98  04/21/21 130/80  04/03/20 (!) 140/96   Immunization History  Administered Date(s) Administered   PFIZER(Purple Top)SARS-COV-2 Vaccination 11/22/2019, 12/13/2019, 06/15/2020   Tdap 12/04/2017   There are no preventive care reminders to display for this patient.     Past Medical History:  Diagnosis Date   HLD (hyperlipidemia) 12/11/2017   History reviewed. No pertinent surgical history.  reports that he has never smoked. He has never used smokeless tobacco. He reports current alcohol use of about 21.0 standard drinks of alcohol per week. He reports that he does not use drugs. family history includes Cancer in his paternal grandfather; Glaucoma in his paternal grandmother; Healthy in his maternal grandfather and mother; Heart disease in his father. No Known Allergies No current outpatient medications on file prior to visit.   No current facility-administered medications on file prior to visit.        ROS:  All others reviewed and negative.  Objective        PE:  BP  (!) 154/98 (BP Location: Right Arm, Patient Position: Sitting, Cuff Size: Large)   Pulse 70   Temp 98.6 F (37 C) (Oral)   Ht 5\' 11"  (1.803 m)   Wt 176 lb (79.8 kg)   SpO2 99%   BMI 24.55 kg/m                 Constitutional: Pt appears in NAD               HENT: Head: NCAT.                Right Ear: External ear normal.                 Left Ear: External ear normal. Bilat tm's with mild erythema.  Max sinus areas non tender.  Pharynx with mild erythema, no exudate               Eyes: . Pupils are equal, round, and reactive to light. Conjunctivae and EOM are normal               Nose: without d/c or deformity               Neck: Neck supple. Gross normal ROM  Cardiovascular: Normal rate and regular rhythm.                 Pulmonary/Chest: Effort normal and breath sounds without rales or wheezing.                Abd:  Soft, NT, ND, + BS, no organomegaly               Neurological: Pt is alert. At baseline orientation, motor grossly intact               Skin: Skin is warm. No rashes, no other new lesions, LE edema - none               Psychiatric: Pt behavior is normal without agitation   Micro: none  Cardiac tracings I have personally interpreted today:  none  Pertinent Radiological findings (summarize): none   Lab Results  Component Value Date   WBC 5.4 03/30/2022   HGB 14.5 03/30/2022   HCT 42.8 03/30/2022   PLT 181.0 03/30/2022   GLUCOSE 89 03/30/2022   CHOL 209 (H) 03/30/2022   TRIG 232.0 (H) 03/30/2022   HDL 49.50 03/30/2022   LDLDIRECT 135.0 03/30/2022   LDLCALC 97 02/26/2019   ALT 52 03/30/2022   AST 35 03/30/2022   NA 137 03/30/2022   K 3.9 03/30/2022   CL 99 03/30/2022   CREATININE 0.83 03/30/2022   BUN 12 03/30/2022   CO2 31 03/30/2022   TSH 0.54 03/30/2022   Assessment/Plan:  Samuel Neal is a 36 y.o. White or Caucasian [1] male with  has a past medical history of HLD (hyperlipidemia) (12/11/2017).  Encounter for well adult exam with  abnormal findings Age and sex appropriate education and counseling updated with regular exercise and diet Referrals for preventative services - for hep c screen Immunizations addressed - none needed Smoking counseling  - none needed Evidence for depression or other mood disorder - none significant Most recent labs reviewed. I have personally reviewed and have noted: 1) the patient's medical and social history 2) The patient's current medications and supplements 3) The patient's height, weight, and BMI have been recorded in the chart   HLD (hyperlipidemia) Lab Results  Component Value Date   LDLCALC 97 02/26/2019   Stable, pt to continue current lower chol diet  Blood pressure elevated without history of HTN ? Htn, ? releated to ETOH use, pt declines tx for now, will continue to monitor at home  Allergic rhinitis Mild to mod, for zyrtec, nasacort, and mucninex bid prn ear eustachian symtpoms as well,  to f/u any worsening symptoms or concerns  Alcohol abuse No symptoms of dependency, but pt counseled to abstain given the high level of use and possible HTN resulting  Vitamin D deficiency Last vitamin D Lab Results  Component Value Date   VD25OH 33.28 03/30/2022   Low, to start oral replacement   Followup: Return in about 1 year (around 03/31/2023).  [name], MD 04/02/2022 8:19 PM [facility]

## 2022-03-30 NOTE — Patient Instructions (Signed)
Please consider taking the OTC zyrtec and nasacort for allergies and snoring, and consider OTC Mucinex (the plain one) for the right inner ear congestion  Please call if you would want the referral to ENT for the snoring  Please cut back on the daily alcohol use  Please continue all other medications as before, and refills have been done if requested.  Please have the pharmacy call with any other refills you may need.  Please continue your efforts at being more active, low cholesterol diet, and weight control.  You are otherwise up to date with prevention measures today.  Please keep your appointments with your specialists as you may have planned  Please go to the LAB at the blood drawing area for the tests to be done  You will be contacted by phone if any changes need to be made immediately.  Otherwise, you will receive a letter about your results with an explanation, but please check with MyChart first.  Please remember to sign up for MyChart if you have not done so, as this will be important to you in the future with finding out test results, communicating by private email, and scheduling acute appointments online when needed.  Please make an Appointment to return for your 1 year visit, or sooner if needed

## 2022-03-31 LAB — HEPATITIS C ANTIBODY: Hepatitis C Ab: NONREACTIVE

## 2022-04-02 ENCOUNTER — Encounter: Payer: Self-pay | Admitting: Internal Medicine

## 2022-04-02 DIAGNOSIS — J309 Allergic rhinitis, unspecified: Secondary | ICD-10-CM | POA: Insufficient documentation

## 2022-04-02 DIAGNOSIS — R030 Elevated blood-pressure reading, without diagnosis of hypertension: Secondary | ICD-10-CM | POA: Insufficient documentation

## 2022-04-02 DIAGNOSIS — F1090 Alcohol use, unspecified, uncomplicated: Secondary | ICD-10-CM | POA: Insufficient documentation

## 2022-04-02 DIAGNOSIS — I10 Essential (primary) hypertension: Secondary | ICD-10-CM | POA: Insufficient documentation

## 2022-04-02 DIAGNOSIS — E559 Vitamin D deficiency, unspecified: Secondary | ICD-10-CM | POA: Insufficient documentation

## 2022-04-02 DIAGNOSIS — F1010 Alcohol abuse, uncomplicated: Secondary | ICD-10-CM | POA: Insufficient documentation

## 2022-04-02 NOTE — Assessment & Plan Note (Signed)
Lab Results  Component Value Date   LDLCALC 97 02/26/2019   Stable, pt to continue current lower chol diet

## 2022-04-02 NOTE — Assessment & Plan Note (Signed)
Last vitamin D Lab Results  Component Value Date   VD25OH 33.28 03/30/2022   Low, to start oral replacement  

## 2022-04-02 NOTE — Assessment & Plan Note (Signed)
Mild to mod, for zyrtec, nasacort, and mucninex bid prn ear eustachian symtpoms as well,  to f/u any worsening symptoms or concerns

## 2022-04-02 NOTE — Assessment & Plan Note (Signed)
No symptoms of dependency, but pt counseled to abstain given the high level of use and possible HTN resulting

## 2022-04-02 NOTE — Assessment & Plan Note (Addendum)
Age and sex appropriate education and counseling updated with regular exercise and diet Referrals for preventative services - for hep c screen Immunizations addressed - none needed Smoking counseling  - none needed Evidence for depression or other mood disorder - none significant Most recent labs reviewed. I have personally reviewed and have noted: 1) the patient's medical and social history 2) The patient's current medications and supplements 3) The patient's height, weight, and BMI have been recorded in the chart  

## 2022-04-02 NOTE — Assessment & Plan Note (Signed)
?   Htn, ? releated to ETOH use, pt declines tx for now, will continue to monitor at home

## 2022-04-05 ENCOUNTER — Telehealth: Payer: BC Managed Care – PPO | Admitting: Physician Assistant

## 2022-04-05 DIAGNOSIS — B356 Tinea cruris: Secondary | ICD-10-CM

## 2022-04-05 DIAGNOSIS — B369 Superficial mycosis, unspecified: Secondary | ICD-10-CM

## 2022-04-05 MED ORDER — CLOTRIMAZOLE-BETAMETHASONE 1-0.05 % EX CREA: 1.0000 | TOPICAL_CREAM | Freq: Every day | CUTANEOUS | 0 refills | Status: DC

## 2022-04-05 NOTE — Progress Notes (Signed)
I have spent 5 minutes in review of e-visit questionnaire, review and updating patient chart, medical decision making and response to patient.   Janice Bodine Cody Xyler Terpening, PA-C    

## 2022-04-05 NOTE — Progress Notes (Signed)
E Visit for Rash  We are sorry that you are not feeling well. Here is how we plan to help!   Based upon your presentation it appears you have a fungal infection.  I have prescribed: Clotrimazole-Betamethasone cream to use as directed for up to 2 weeks.    HOME CARE:  Take cool showers and avoid direct sunlight. Apply cool compress or wet dressings. Take a bath in an oatmeal bath.  Sprinkle content of one Aveeno packet under running faucet with comfortably warm water.  Bathe for 15-20 minutes, 1-2 times daily.  Pat dry with a towel. Do not rub the rash. Use hydrocortisone cream. Take an antihistamine like Benadryl for widespread rashes that itch.  The adult dose of Benadryl is 25-50 mg by mouth 4 times daily. Caution:  This type of medication may cause sleepiness.  Do not drink alcohol, drive, or operate dangerous machinery while taking antihistamines.  Do not take these medications if you have prostate enlargement.  Read package instructions thoroughly on all medications that you take.  GET HELP RIGHT AWAY IF:  Symptoms don't go away after treatment. Severe itching that persists. If you rash spreads or swells. If you rash begins to smell. If it blisters and opens or develops a yellow-brown crust. You develop a fever. You have a sore throat. You become short of breath.  MAKE SURE YOU:  Understand these instructions. Will watch your condition. Will get help right away if you are not doing well or get worse.  Thank you for choosing an e-visit.  Your e-visit answers were reviewed by a board certified advanced clinical practitioner to complete your personal care plan. Depending upon the condition, your plan could have included both over the counter or prescription medications.  Please review your pharmacy choice. Make sure the pharmacy is open so you can pick up prescription now. If there is a problem, you may contact your provider through [employer] and have the prescription  routed to another pharmacy.  Your safety is important to [place]. If you have drug allergies check your prescription carefully.   For the next 24 hours you can use MyChart to ask questions about today's visit, request a non-urgent call back, or ask for a work or school excuse. You will get an email in the next two days asking about your experience. I hope that your e-visit has been valuable and will speed your recovery.

## 2022-05-25 ENCOUNTER — Ambulatory Visit (INDEPENDENT_AMBULATORY_CARE_PROVIDER_SITE_OTHER): Payer: BC Managed Care – PPO | Admitting: Internal Medicine

## 2022-05-25 VITALS — BP 130/84 | HR 86 | Temp 97.6°F | Ht 71.0 in | Wt 171.0 lb

## 2022-05-25 DIAGNOSIS — W57XXXA Bitten or stung by nonvenomous insect and other nonvenomous arthropods, initial encounter: Secondary | ICD-10-CM

## 2022-05-25 DIAGNOSIS — E559 Vitamin D deficiency, unspecified: Secondary | ICD-10-CM | POA: Diagnosis not present

## 2022-05-25 DIAGNOSIS — R030 Elevated blood-pressure reading, without diagnosis of hypertension: Secondary | ICD-10-CM | POA: Diagnosis not present

## 2022-05-25 DIAGNOSIS — T148XXA Other injury of unspecified body region, initial encounter: Secondary | ICD-10-CM

## 2022-05-25 MED ORDER — DOXYCYCLINE HYCLATE 100 MG PO TABS: 100.0000 mg | ORAL_TABLET | Freq: Two times a day (BID) | ORAL | 0 refills | Status: DC

## 2022-05-25 NOTE — Progress Notes (Signed)
Patient ID: Samuel Neal, male   DOB: [birth year], 36 y.o.   MRN: [id number]        Chief Complaint: follow up numerous tick bites       HPI:  Samuel Neal is a 36 y.o. male here to f/u with numerous tick bites 3 days ago, maybe even 70 after walking through the woods with large population small ticks.  Has numerous bites to trunk and extremities.  Pt denies chest pain, increased sob or doe, wheezing, orthopnea, PND, increased LE swelling, palpitations, dizziness or syncope.   Pt denies polydipsia, polyuria, or new focal neuro s/s.   Has several already with tender redness starting.          Wt Readings from Last 3 Encounters:  05/25/22 171 lb (77.6 kg)  03/30/22 176 lb (79.8 kg)  04/21/21 175 lb (79.4 kg)   BP Readings from Last 3 Encounters:  05/25/22 130/84  03/30/22 (!) 154/98  04/21/21 130/80         Past Medical History:  Diagnosis Date   HLD (hyperlipidemia) 12/11/2017   History reviewed. No pertinent surgical history.  reports that he has never smoked. He has never used smokeless tobacco. He reports current alcohol use of about 21.0 standard drinks of alcohol per week. He reports that he does not use drugs. family history includes Cancer in his paternal grandfather; Glaucoma in his paternal grandmother; Healthy in his maternal grandfather and mother; Heart disease in his father. No Known Allergies Current Outpatient Medications on File Prior to Visit  Medication Sig Dispense Refill   clotrimazole-betamethasone (LOTRISONE) cream Apply 1 Application topically daily. 30 g 0   No current facility-administered medications on file prior to visit.        ROS:  All others reviewed and negative.  Objective        PE:  BP 130/84   Pulse 86   Temp 97.6 F (36.4 C)   Ht 5\' 11"  (1.803 m)   Wt 171 lb (77.6 kg)   SpO2 97%   BMI 23.85 kg/m                 Constitutional: Pt appears in NAD               HENT: Head: NCAT.                Right Ear: External ear normal.                  Left Ear: External ear normal.                Eyes: . Pupils are equal, round, and reactive to light. Conjunctivae and EOM are normal               Nose: without d/c or deformity               Neck: Neck supple. Gross normal ROM               Cardiovascular: Normal rate and regular rhythm.                 Pulmonary/Chest: Effort normal and breath sounds without rales or wheezing.                 Neurological: Pt is alert. At baseline orientation, motor grossly intact               Skin: , LE edema - none,numerous bites to trunk and  extremities several with small red tender areas               Psychiatric: Pt behavior is normal without agitation   Micro: none  Cardiac tracings I have personally interpreted today:  none  Pertinent Radiological findings (summarize): none   Lab Results  Component Value Date   WBC 5.4 03/30/2022   HGB 14.5 03/30/2022   HCT 42.8 03/30/2022   PLT 181.0 03/30/2022   GLUCOSE 89 03/30/2022   CHOL 209 (H) 03/30/2022   TRIG 232.0 (H) 03/30/2022   HDL 49.50 03/30/2022   LDLDIRECT 135.0 03/30/2022   LDLCALC 97 02/26/2019   ALT 52 03/30/2022   AST 35 03/30/2022   NA 137 03/30/2022   K 3.9 03/30/2022   CL 99 03/30/2022   CREATININE 0.83 03/30/2022   BUN 12 03/30/2022   CO2 31 03/30/2022   TSH 0.54 03/30/2022   Assessment/Plan:  Samuel Neal is a 36 y.o. White or Caucasian [1] male with  has a past medical history of HLD (hyperlipidemia) (12/11/2017).  Tick bite, infected Numerous this time, several starting to become infected it seems, for doxycycline 100 bid x 10 days  Vitamin D deficiency Last vitamin D Lab Results  Component Value Date   VD25OH 33.28 03/30/2022   Low, to start oral replacement   Blood pressure elevated without history of HTN Improved today, cont to follow with low salt, diet, wt control  Followup: Return if symptoms worsen or fail to improve.  [name], MD 05/28/2022 7:54 PM [facility]

## 2022-05-25 NOTE — Patient Instructions (Addendum)
Please take all new medication as prescribed - the antibiotic  Please continue all other medications as before, and refills have been done if requested.  Please have the pharmacy call with any other refills you may need.  Please continue your efforts at being more active, low cholesterol diet, and weight control.  Please keep your appointments with your specialists as you may have planned    

## 2022-05-28 ENCOUNTER — Encounter: Payer: Self-pay | Admitting: Internal Medicine

## 2022-05-28 NOTE — Assessment & Plan Note (Signed)
Last vitamin D Lab Results  Component Value Date   VD25OH 33.28 03/30/2022   Low, to start oral replacement

## 2022-05-28 NOTE — Assessment & Plan Note (Signed)
Numerous this time, several starting to become infected it seems, for doxycycline 100 bid x 10 days

## 2022-05-28 NOTE — Assessment & Plan Note (Signed)
Improved today, cont to follow with low salt, diet, wt control

## 2023-01-05 ENCOUNTER — Ambulatory Visit (INDEPENDENT_AMBULATORY_CARE_PROVIDER_SITE_OTHER): Payer: BC Managed Care – PPO | Admitting: Family Medicine

## 2023-01-05 ENCOUNTER — Encounter: Payer: Self-pay | Admitting: Family Medicine

## 2023-01-05 ENCOUNTER — Encounter: Payer: Self-pay | Admitting: Internal Medicine

## 2023-01-05 VITALS — BP 142/88 | HR 85 | Temp 97.6°F | Ht 71.0 in | Wt 170.0 lb

## 2023-01-05 DIAGNOSIS — H029 Unspecified disorder of eyelid: Secondary | ICD-10-CM | POA: Diagnosis not present

## 2023-01-05 DIAGNOSIS — R030 Elevated blood-pressure reading, without diagnosis of hypertension: Secondary | ICD-10-CM

## 2023-01-05 NOTE — Progress Notes (Signed)
Subjective:     Patient ID: Samuel Neal, male    DOB: 03/28/1986, 37 y.o.   MRN: [id number]  Chief Complaint  Patient presents with   Eye Problem    Bump on top left eyelid, noticed it 3-4 weeks ago    HPI Patient is in today for a bump beneath his left eyelid for at least one month. No redness, swelling or drainage. No pain unless he presses directly on it. States his father had a melanoma which presented similarly.   Denies vision changes or foreign body sensation.   States his BP is elevated when he first comes in for a visit and when stressed. States it usually goes down after being here for a while.   There are no preventive care reminders to display for this patient.  Past Medical History:  Diagnosis Date   HLD (hyperlipidemia) 12/11/2017    History reviewed. No pertinent surgical history.  Family History  Problem Relation Age of Onset   Healthy Mother    Heart disease Father    Healthy Maternal Grandfather    Glaucoma Paternal Grandmother    Cancer Paternal Grandfather     Social History   Socioeconomic History   Marital status: Married    Spouse name: Not on file   Number of children: 0   Years of education: 16   Highest education level: Not on file  Occupational History   Occupation: Sales  Tobacco Use   Smoking status: Never   Smokeless tobacco: Never  Substance and Sexual Activity   Alcohol use: Yes    Alcohol/week: 21.0 standard drinks of alcohol    Types: 21 Cans of beer per week   Drug use: No   Sexual activity: Not on file  Other Topics Concern   Not on file  Social History Narrative   Fun: Mountain bike, motor cycles   Social Determinants of Health   Financial Resource Strain: Not on file  Food Insecurity: Not on file  Transportation Needs: Not on file  Physical Activity: Not on file  Stress: Not on file  Social Connections: Not on file  Intimate Partner Violence: Not on file    Outpatient Medications Prior to Visit   Medication Sig Dispense Refill   clotrimazole-betamethasone (LOTRISONE) cream Apply 1 Application topically daily. 30 g 0   doxycycline (VIBRA-TABS) 100 MG tablet Take 1 tablet (100 mg total) by mouth 2 (two) times daily. 20 tablet 0   No facility-administered medications prior to visit.    No Known Allergies  ROS     Objective:    Physical Exam Constitutional:      General: He is not in acute distress.    Appearance: He is not ill-appearing.  Eyes:     General:        Left eye: No foreign body, discharge or hordeolum.     Extraocular Movements: Extraocular movements intact.     Left eye: Normal extraocular motion.     Conjunctiva/sclera: Conjunctivae normal.  Neck:     Thyroid: No thyromegaly or thyroid tenderness.  Cardiovascular:     Rate and Rhythm: Normal rate.  Pulmonary:     Effort: Pulmonary effort is normal.  Musculoskeletal:     Cervical back: Normal range of motion and neck supple.  Lymphadenopathy:     Cervical: No cervical adenopathy.  Skin:    General: Skin is warm and dry.  Neurological:     General: No focal deficit present.  Mental Status: He is alert and oriented to person, place, and time.  Psychiatric:        Mood and Affect: Mood normal.        Behavior: Behavior normal.        Thought Content: Thought content normal.     BP (!) 142/88 (BP Location: Left Arm, Patient Position: Sitting, Cuff Size: Large)   Pulse 85   Temp 97.6 F (36.4 C) (Temporal)   Ht  (1.803 m)   Wt 170 lb (77.1 kg)   SpO2 98%   BMI 23.71 kg/m  Wt Readings from Last 3 Encounters:  01/05/23 170 lb (77.1 kg)  05/25/22 171 lb (77.6 kg)  03/30/22 176 lb (79.8 kg)        Assessment & Plan:   Problem List Items Addressed This Visit       Other   Blood pressure elevated without history of HTN   Eyelid abnormality - Primary   No sign of infection. No red flag symptoms. He will call and schedule with his ophthalmologist.  Counseling on ?HTN. He will  monitor BP outside of here. DASH diet handout provided. Follow up here if readings are abnormal.   I have discontinued Cristal Deer K. Sergent's clotrimazole-betamethasone and doxycycline.  No orders of the defined types were placed in this encounter.

## 2023-01-05 NOTE — Patient Instructions (Addendum)
Call and schedule with your eye doctor as discussed.   Check your blood pressure outside of here and make sure it is not staying elevated.   Watch sodium. Limit alcohol intake. Get at least 150 minutes of physical activity each week.   Normal blood pressure is 130/80 or lower.

## 2023-04-05 ENCOUNTER — Encounter: Payer: BC Managed Care – PPO | Admitting: Internal Medicine

## 2023-05-08 NOTE — Progress Notes (Unsigned)
    Subjective:    Patient ID: Samuel Neal, Samuel Neal    DOB: 1986/04/06, 37 y.o.   MRN: [id number]      HPI Samuel Neal is here for No chief complaint on file.    Friday evening -the tractor he was riding was about to overturn so he bailed off tractor -he jumped off - forceful jump and right boot got caught and pulled leg across fender on lateral right leg.  There was a lot of pain and swelling.  Unfortunately he had to finish up doing what he was doing and he was on the leg a lot over the next 2 days.  Since then the swelling has gone down a lot, but started to increase again today after driving a long time.  The pain was initially everywhere, but now the pain is more localized to the proximal right lateral leg.  At times the pain radiates all the way down towards the ankle when he is walking.  He does not have any pain at rest-only pain with applying pressure on the leg.   No N/T.      Medications and allergies reviewed with patient and updated if appropriate.  No current outpatient medications on file prior to visit.   No current facility-administered medications on file prior to visit.    Review of Systems     Objective:   Vitals:   05/09/23 1542  BP: 126/82  Pulse: 78  Temp: 98.6 F (37 C)  SpO2: 98%   BP Readings from Last 3 Encounters:  05/09/23 126/82  01/05/23 (!) 142/88  05/25/22 130/84   Wt Readings from Last 3 Encounters:  05/09/23 174 lb (78.9 kg)  01/05/23 170 lb (77.1 kg)  05/25/22 171 lb (77.6 kg)   Body mass index is 24.27 kg/m.    Physical Exam Constitutional:      General: He is not in acute distress.    Appearance: Normal appearance. He is not ill-appearing.  HENT:     Head: Normocephalic and atraumatic.  Musculoskeletal:        General: No deformity.     Comments: Right lateral proximal leg there is bruising in a linear fashion downward and tenderness with palpation from the head of the fibula down one third of the lateral right  leg.  No tenderness distal lateral right leg.  Mild swelling proximal lateral right leg.  Normal sensation.  Normal strength.  No ankle deformity or ankle pain lateral or medial aspect  Skin:    General: Skin is warm and dry.     Findings: Bruising present.  Neurological:     Mental Status: He is alert.            Assessment & Plan:     Right leg pain: Injury 4 days ago to his right proximal lateral lower leg when trying to get off of the tractor Swelling, bruising and pain more localized to the proximal lateral lower leg-will rule out infection with x-rays today Pain sometimes radiates down for the ankle-doubt ankle fracture, but [place] go [place] days and we will go ahead and get an x-ray of the ankle as well Depending on x-ray results and his symptoms over the next 24 hours consider sports medicine evaluation for soft tissue injury Continue ibuprofen for pain

## 2023-05-09 ENCOUNTER — Encounter: Payer: Self-pay | Admitting: Internal Medicine

## 2023-05-09 ENCOUNTER — Ambulatory Visit (INDEPENDENT_AMBULATORY_CARE_PROVIDER_SITE_OTHER): Payer: BC Managed Care – PPO

## 2023-05-09 ENCOUNTER — Ambulatory Visit (INDEPENDENT_AMBULATORY_CARE_PROVIDER_SITE_OTHER): Payer: BC Managed Care – PPO | Admitting: Internal Medicine

## 2023-05-09 VITALS — BP 126/82 | HR 78 | Temp 98.6°F | Ht 71.0 in | Wt 174.0 lb

## 2023-05-09 DIAGNOSIS — M79604 Pain in right leg: Secondary | ICD-10-CM

## 2023-05-09 NOTE — Patient Instructions (Addendum)
     Have xrays downstairs.    Medications changes include :   none     Return if symptoms worsen or fail to improve.  

## 2024-03-01 ENCOUNTER — Encounter: Payer: Self-pay | Admitting: Internal Medicine

## 2024-03-01 NOTE — Telephone Encounter (Signed)
 Hello - most often these lumps under the skin are benign as they will get to a certain size, then stop growing but they dont hurt anything.  So it most likely is ok to follow this for now, though you may want to be seen in the office if you are convinced it is getting larger and larger.    Thanks!

## 2024-05-31 ENCOUNTER — Telehealth: Payer: Self-pay

## 2024-05-31 NOTE — Telephone Encounter (Addendum)
 Patient walked into office concerned for home BP readings and one reading at the [facility].  Home: 5a: 175/90 HR 60 8a: 173/100 930a: 172/109  At Dr. Lawerance 162/90  Patient denies CP, SOB, Palpitations, dizziness, blurred vision, headaches. Educated patient one when to go to the emergency room and provided BP log to use until appointment on Monday. Also instructed to take home BP cuff to [facility] and check against their machine.   Patient had appt with Dr. Geofm on Monday.   No additional questions at this time.

## 2024-06-02 ENCOUNTER — Encounter: Payer: Self-pay | Admitting: Internal Medicine

## 2024-06-02 NOTE — Progress Notes (Unsigned)
    Subjective:    Patient ID: Samuel Neal, male    DOB: 1986/07/03, 38 y.o.   MRN: [id number]      HPI [place] is here for No chief complaint on file.   Valsartan  80     Medications and allergies reviewed with patient and updated if appropriate.  No current outpatient medications on file prior to visit.   No current facility-administered medications on file prior to visit.    Review of Systems     Objective:  There were no vitals filed for this visit. BP Readings from Last 3 Encounters:  05/09/23 126/82  01/05/23 (!) 142/88  05/25/22 130/84   Wt Readings from Last 3 Encounters:  05/09/23 174 lb (78.9 kg)  01/05/23 170 lb (77.1 kg)  05/25/22 171 lb (77.6 kg)   There is no height or weight on file to calculate BMI.    Physical Exam         Assessment & Plan:    See Problem List for Assessment and Plan of chronic medical problems.

## 2024-06-02 NOTE — Patient Instructions (Addendum)
 Blood work was ordered.       Medications changes include :   Start valsartan  80 mg daily    Return in about 4 weeks (around 07/01/2024) for hypertension with PCP.    Hypertension, Adult High blood pressure (hypertension) is when the force of blood pumping through the arteries is too strong. The arteries are the blood vessels that carry blood from the heart throughout the body. Hypertension forces the heart to work harder to pump blood and may cause arteries to become narrow or stiff. Untreated or uncontrolled hypertension can lead to a heart attack, heart failure, a stroke, kidney disease, and other problems. A blood pressure reading consists of a higher number over a lower number. Ideally, your blood pressure should be below 120/80. The first (top) number is called the systolic pressure. It is a measure of the pressure in your arteries as your heart beats. The second (bottom) number is called the diastolic pressure. It is a measure of the pressure in your arteries as the heart relaxes. What are the causes? The exact cause of this condition is not known. There are some conditions that result in high blood pressure. What increases the risk? Certain factors may make you more likely to develop high blood pressure. Some of these risk factors are under your control, including: Smoking. Not getting enough exercise or physical activity. Being overweight. Having too much fat, sugar, calories, or salt (sodium) in your diet. Drinking too much alcohol. Other risk factors include: Having a personal history of heart disease, diabetes, high cholesterol, or kidney disease. Stress. Having a family history of high blood pressure and high cholesterol. Having obstructive sleep apnea. Age. The risk increases with age. What are the signs or symptoms? High blood pressure may not cause symptoms. Very high blood pressure (hypertensive crisis) may cause: Headache. Fast or irregular heartbeats  (palpitations). Shortness of breath. Nosebleed. Nausea and vomiting. Vision changes. Severe chest pain, dizziness, and seizures. How is this diagnosed? This condition is diagnosed by measuring your blood pressure while you are seated, with your arm resting on a flat surface, your legs uncrossed, and your feet flat on the floor. The cuff of the blood pressure monitor will be placed directly against the skin of your upper arm at the level of your heart. Blood pressure should be measured at least twice using the same arm. Certain conditions can cause a difference in blood pressure between your right and left arms. If you have a high blood pressure reading during one visit or you have normal blood pressure with other risk factors, you may be asked to: Return on a different day to have your blood pressure checked again. Monitor your blood pressure at home for 1 week or longer. If you are diagnosed with hypertension, you may have other blood or imaging tests to help your health care provider understand your overall risk for other conditions. How is this treated? This condition is treated by making healthy lifestyle changes, such as eating healthy foods, exercising more, and reducing your alcohol intake. You may be referred for counseling on a healthy diet and physical activity. Your health care provider may prescribe medicine if lifestyle changes are not enough to get your blood pressure under control and if: Your systolic blood pressure is above 130. Your diastolic blood pressure is above 80. Your personal target blood pressure may vary depending on your medical conditions, your age, and other factors. Follow these instructions at home: Eating and drinking  Eat a diet that is high in fiber and potassium, and low in sodium, added sugar, and fat. An example of this eating plan is called the DASH diet. DASH stands for Dietary Approaches to Stop Hypertension. To eat this way: Eat plenty of fresh fruits  and vegetables. Try to fill one half of your plate at each meal with fruits and vegetables. Eat whole grains, such as whole-wheat pasta, brown rice, or whole-grain bread. Fill about one fourth of your plate with whole grains. Eat or drink low-fat dairy products, such as skim milk or low-fat yogurt. Avoid fatty cuts of meat, processed or cured meats, and poultry with skin. Fill about one fourth of your plate with lean proteins, such as fish, chicken without skin, beans, eggs, or tofu. Avoid pre-made and processed foods. These tend to be higher in sodium, added sugar, and fat. Reduce your daily sodium intake. Many people with hypertension should eat less than 1,500 mg of sodium a day. Do not drink alcohol if: Your health care provider tells you not to drink. You are pregnant, may be pregnant, or are planning to become pregnant. If you drink alcohol: Limit how much you have to: 0-1 drink a day for women. 0-2 drinks a day for men. Know how much alcohol is in your drink. In the [place] one drink equals one 12 oz bottle of beer (355 mL), one 5 oz glass of wine (148 mL), or one 1 oz glass of hard liquor (44 mL). Lifestyle  Work with your health care provider to maintain a healthy body weight or to lose weight. Ask what an ideal weight is for you. Get at least 30 minutes of exercise that causes your heart to beat faster (aerobic exercise) most days of the week. Activities may include walking, swimming, or biking. Include exercise to strengthen your muscles (resistance exercise), such as Pilates or lifting weights, as part of your weekly exercise routine. Try to do these types of exercises for 30 minutes at least 3 days a week. Do not use any products that contain nicotine or tobacco. These products include cigarettes, chewing tobacco, and vaping devices, such as e-cigarettes. If you need help quitting, ask your health care provider. Monitor your blood pressure at home as told by your health care  provider. Keep all follow-up visits. This is important. Medicines Take over-the-counter and prescription medicines only as told by your health care provider. Follow directions carefully. Blood pressure medicines must be taken as prescribed. Do not skip doses of blood pressure medicine. Doing this puts you at risk for problems and can make the medicine less effective. Ask your health care provider about side effects or reactions to medicines that you should watch for. Contact a health care provider if you: Think you are having a reaction to a medicine you are taking. Have headaches that keep coming back (recurring). Feel dizzy. Have swelling in your ankles. Have trouble with your vision. Get help right away if you: Develop a severe headache or confusion. Have unusual weakness or numbness. Feel faint. Have severe pain in your chest or abdomen. Vomit repeatedly. Have trouble breathing. These symptoms may be an emergency. Get help right away. Call 911. Do not wait to see if the symptoms will go away. Do not drive yourself to the hospital. Summary Hypertension is when the force of blood pumping through your arteries is too strong. If this condition is not controlled, it may put you at risk for serious complications. Your personal target blood pressure may  vary depending on your medical conditions, your age, and other factors. For most people, a normal blood pressure is less than 120/80. Hypertension is treated with lifestyle changes, medicines, or a combination of both. Lifestyle changes include losing weight, eating a healthy, low-sodium diet, exercising more, and limiting alcohol. This information is not intended to replace advice given to you by your health care provider. Make sure you discuss any questions you have with your health care provider. Document Revised: 07/06/2021 Document Reviewed: 07/06/2021 Elsevier Patient Education  2024 [employer].

## 2024-06-03 ENCOUNTER — Ambulatory Visit: Admitting: Internal Medicine

## 2024-06-03 ENCOUNTER — Ambulatory Visit: Payer: Self-pay | Admitting: Internal Medicine

## 2024-06-03 VITALS — BP 148/100 | HR 68 | Temp 98.4°F | Ht 71.0 in | Wt 167.0 lb

## 2024-06-03 DIAGNOSIS — I10 Essential (primary) hypertension: Secondary | ICD-10-CM | POA: Diagnosis not present

## 2024-06-03 DIAGNOSIS — E782 Mixed hyperlipidemia: Secondary | ICD-10-CM

## 2024-06-03 LAB — CBC WITH DIFFERENTIAL/PLATELET
Basophils Absolute: 0 K/uL (ref 0.0–0.1)
Basophils Relative: 0.6 % (ref 0.0–3.0)
Eosinophils Absolute: 0.1 K/uL (ref 0.0–0.7)
Eosinophils Relative: 1.5 % (ref 0.0–5.0)
HCT: 42.1 % (ref 39.0–52.0)
Hemoglobin: 14.4 g/dL (ref 13.0–17.0)
Lymphocytes Relative: 23.9 % (ref 12.0–46.0)
Lymphs Abs: 1 K/uL (ref 0.7–4.0)
MCHC: 34.3 g/dL (ref 30.0–36.0)
MCV: 89.1 fl (ref 78.0–100.0)
Monocytes Absolute: 0.4 K/uL (ref 0.1–1.0)
Monocytes Relative: 9.7 % (ref 3.0–12.0)
Neutro Abs: 2.7 K/uL (ref 1.4–7.7)
Neutrophils Relative %: 64.3 % (ref 43.0–77.0)
Platelets: 163 K/uL (ref 150.0–400.0)
RBC: 4.72 Mil/uL (ref 4.22–5.81)
RDW: 12.9 % (ref 11.5–15.5)
WBC: 4.2 K/uL (ref 4.0–10.5)

## 2024-06-03 LAB — COMPREHENSIVE METABOLIC PANEL WITH GFR
ALT: 34 U/L (ref 0–53)
AST: 26 U/L (ref 0–37)
Albumin: 4.8 g/dL (ref 3.5–5.2)
Alkaline Phosphatase: 74 U/L (ref 39–117)
BUN: 14 mg/dL (ref 6–23)
CO2: 32 meq/L (ref 19–32)
Calcium: 9.6 mg/dL (ref 8.4–10.5)
Chloride: 95 meq/L — ABNORMAL LOW (ref 96–112)
Creatinine, Ser: 0.81 mg/dL (ref 0.40–1.50)
GFR: 112.17 mL/min (ref >60.00)
Glucose, Bld: 86 mg/dL (ref 70–99)
Potassium: 4.2 meq/L (ref 3.5–5.1)
Sodium: 133 meq/L — ABNORMAL LOW (ref 135–145)
Total Bilirubin: 0.6 mg/dL (ref 0.2–1.2)
Total Protein: 7.1 g/dL (ref 6.0–8.3)

## 2024-06-03 LAB — LIPID PANEL
Cholesterol: 170 mg/dL (ref 0–200)
HDL: 51.7 mg/dL (ref >39.00)
LDL Cholesterol: 104 mg/dL — ABNORMAL HIGH (ref 0–99)
NonHDL: 118.04
Total CHOL/HDL Ratio: 3
Triglycerides: 68 mg/dL (ref 0.0–149.0)
VLDL: 13.6 mg/dL (ref 0.0–40.0)

## 2024-06-03 LAB — TSH: TSH: 0.64 u[IU]/mL (ref 0.35–5.50)

## 2024-06-03 MED ORDER — VALSARTAN 80 MG PO TABS: 80.0000 mg | ORAL_TABLET | Freq: Every day | ORAL | 5 refills | Status: DC

## 2024-06-03 NOTE — Assessment & Plan Note (Signed)
 Chronic Lifestyle controlled Cmp, lipid, tsh

## 2024-06-03 NOTE — Assessment & Plan Note (Addendum)
 New BP consistently elevated at home, recent eye appt and here Asymptomatic Start valsartan  80 mg daily Check cbc, cmp, tsh Continue regular exercise Limit sodium, caffeine and alcohol F/u in one month

## 2024-07-01 ENCOUNTER — Encounter: Payer: Self-pay | Admitting: Internal Medicine

## 2024-07-01 ENCOUNTER — Ambulatory Visit (INDEPENDENT_AMBULATORY_CARE_PROVIDER_SITE_OTHER): Admitting: Internal Medicine

## 2024-07-01 VITALS — BP 160/88 | HR 70 | Temp 98.8°F | Ht 71.0 in | Wt 173.0 lb

## 2024-07-01 DIAGNOSIS — I10 Essential (primary) hypertension: Secondary | ICD-10-CM

## 2024-07-01 DIAGNOSIS — Z23 Encounter for immunization: Secondary | ICD-10-CM

## 2024-07-01 DIAGNOSIS — F1090 Alcohol use, unspecified, uncomplicated: Secondary | ICD-10-CM

## 2024-07-01 DIAGNOSIS — Z0000 Encounter for general adult medical examination without abnormal findings: Secondary | ICD-10-CM | POA: Diagnosis not present

## 2024-07-01 DIAGNOSIS — E559 Vitamin D deficiency, unspecified: Secondary | ICD-10-CM | POA: Diagnosis not present

## 2024-07-01 DIAGNOSIS — L989 Disorder of the skin and subcutaneous tissue, unspecified: Secondary | ICD-10-CM

## 2024-07-01 DIAGNOSIS — E782 Mixed hyperlipidemia: Secondary | ICD-10-CM

## 2024-07-01 DIAGNOSIS — Z0001 Encounter for general adult medical examination with abnormal findings: Secondary | ICD-10-CM

## 2024-07-01 MED ORDER — VALSARTAN 320 MG PO TABS: 320.0000 mg | ORAL_TABLET | Freq: Every day | ORAL | 3 refills | Status: AC

## 2024-07-01 NOTE — Progress Notes (Unsigned)
 Patient ID: Samuel Neal, male   DOB: 1986-02-15, 38 y.o.   MRN: [id number]         Chief Complaint:: wellness exam and hld, htn, skin lesion       HPI:  Samuel Neal is a 38 y.o. male here for wellness exam; declines hpv and hep b vax, o/w up to date                        Also Pt denies chest pain, increased sob or doe, wheezing, orthopnea, PND, increased LE swelling, palpitations, dizziness or syncope.   Pt denies polydipsia, polyuria, or new focal neuro s/s.    Pt denies fever, wt loss, night sweats, loss of appetite, or other constitutional symptoms  Has skin tag to chest, asking for derm referral for whole body check.  Alcohol use much improved now only 2 glass wine per day   Wt Readings from Last 3 Encounters:  07/01/24 173 lb (78.5 kg)  06/03/24 167 lb (75.8 kg)  05/09/23 174 lb (78.9 kg)   BP Readings from Last 3 Encounters:  07/01/24 (!) 160/88  06/03/24 (!) 148/100  05/09/23 126/82   Immunization History  Administered Date(s) Administered   Influenza, Seasonal, Injecte, Preservative Fre 07/01/2024   PFIZER(Purple Top)SARS-COV-2 Vaccination 11/22/2019, 12/13/2019, 06/15/2020   Tdap 12/04/2017   Health Maintenance Due  Topic Date Due   Hepatitis B Vaccines 19-59 Average Risk (1 of 3 - 19+ 3-dose series) Never done   HPV VACCINES (1 - 3-dose SCDM series) Never done      Past Medical History:  Diagnosis Date   HLD (hyperlipidemia) 12/11/2017   History reviewed. No pertinent surgical history.  reports that he has never smoked. He has never used smokeless tobacco. He reports current alcohol use of about 21.0 standard drinks of alcohol per week. He reports that he does not use drugs. family history includes Cancer in his paternal grandfather; Glaucoma in his paternal grandmother; Healthy in his maternal grandfather and mother; Heart disease in his father. No Known Allergies Current Outpatient Medications on File Prior to Visit  Medication Sig Dispense Refill    magnesium gluconate (MAGONATE) 500 (27 Mg) MG TABS tablet Take 500 mg by mouth in the morning and at bedtime.     Multiple Vitamin (MULTIVITAMIN) tablet Take 1 tablet by mouth daily.     No current facility-administered medications on file prior to visit.        ROS:  All others reviewed and negative.  Objective        PE:  BP (!) 160/88 (BP Location: Right Arm, Patient Position: Sitting, Cuff Size: Normal)   Pulse 70   Temp 98.8 F (37.1 C) (Oral)   Ht 5' 11 (1.803 m)   Wt 173 lb (78.5 kg)   SpO2 99%   BMI 24.13 kg/m                 Constitutional: Pt appears in NAD               HENT: Head: NCAT.                Right Ear: External ear normal.                 Left Ear: External ear normal.                Eyes: . Pupils are equal, round, and reactive to light. Conjunctivae and EOM are  normal               Nose: without d/c or deformity               Neck: Neck supple. Gross normal ROM               Cardiovascular: Normal rate and regular rhythm.                 Pulmonary/Chest: Effort normal and breath sounds without rales or wheezing.                Abd:  Soft, NT, ND, + BS, no organomegaly               Neurological: Pt is alert. At baseline orientation, motor grossly intact               Skin: Skin is warm. No rashes, no other new lesions, LE edema - none               Psychiatric: Pt behavior is normal without agitation   Micro: none  Cardiac tracings I have personally interpreted today:  none  Pertinent Radiological findings (summarize): none   Lab Results  Component Value Date   WBC 4.2 06/03/2024   HGB 14.4 06/03/2024   HCT 42.1 06/03/2024   PLT 163.0 06/03/2024   GLUCOSE 86 06/03/2024   CHOL 170 06/03/2024   TRIG 68.0 06/03/2024   HDL 51.70 06/03/2024   LDLDIRECT 135.0 03/30/2022   LDLCALC 104 (H) 06/03/2024   ALT 34 06/03/2024   AST 26 06/03/2024   NA 133 (L) 06/03/2024   K 4.2 06/03/2024   CL 95 (L) 06/03/2024   CREATININE 0.81 06/03/2024   BUN 14  06/03/2024   CO2 32 06/03/2024   TSH 0.64 06/03/2024   Assessment/Plan:  Samuel Neal is a 38 y.o. White or Caucasian [1] male with  has a past medical history of HLD (hyperlipidemia) (12/11/2017).  Encounter for well adult exam with abnormal findings Age and sex appropriate education and counseling updated with regular exercise and diet Referrals for preventative services - none needed Immunizations addressed - declines hpv and hep b vax Smoking counseling  - none needed Evidence for depression or other mood disorder - none significant Most recent labs reviewed. I have personally reviewed and have noted: 1) the patient's medical and social history 2) The patient's current medications and supplements 3) The patient's height, weight, and BMI have been recorded in the chart   Vitamin D  deficiency Last vitamin D  Lab Results  Component Value Date   VD25OH 33.28 03/30/2022   Low, to start oral replacement   Hypertension BP Readings from Last 3 Encounters:  07/01/24 (!) 160/88  06/03/24 (!) 148/100  05/09/23 126/82   Uncontrolled on recent start diovan  80 mg, pt for increased diovan  320 mg every day, f/u bp at home and next visit   HLD (hyperlipidemia) Lab Results  Component Value Date   LDLCALC 104 (H) 06/03/2024   uncontrolled pt for lower chol diet, declines statin  Followup: Return in about 6 months (around 12/30/2024).  Lynwood Rush, MD 07/03/2024 9:10 PM [facility]

## 2024-07-01 NOTE — Patient Instructions (Signed)
 Ok to increase the valsartan  to 320 mg per day  Please continue to check your BP at home as you do, and call in 2 weeks if not improved to being mostly less than 130/80  Please continue all other medications as before, and refills have been done if requested.  Please have the pharmacy call with any other refills you may need.  Please continue your efforts at being more active, low cholesterol diet, and weight control.  You are otherwise up to date with prevention measures today.  Please keep your appointments with your specialists as you may have planned  You will be contacted regarding the referral for: dermatology  Please make an Appointment to return in 6 months, or sooner if needed

## 2024-07-03 ENCOUNTER — Encounter: Payer: Self-pay | Admitting: Internal Medicine

## 2024-07-03 DIAGNOSIS — L989 Disorder of the skin and subcutaneous tissue, unspecified: Secondary | ICD-10-CM | POA: Insufficient documentation

## 2024-07-03 NOTE — Assessment & Plan Note (Signed)
 Age and sex appropriate education and counseling updated with regular exercise and diet Referrals for preventative services - none needed Immunizations addressed - declines hpv and hep b vax Smoking counseling  - none needed Evidence for depression or other mood disorder - none significant Most recent labs reviewed. I have personally reviewed and have noted: 1) the patient's medical and social history 2) The patient's current medications and supplements 3) The patient's height, weight, and BMI have been recorded in the chart

## 2024-07-03 NOTE — Assessment & Plan Note (Signed)
 Lab Results  Component Value Date   LDLCALC 104 (H) 06/03/2024   uncontrolled pt for lower chol diet, declines statin

## 2024-07-03 NOTE — Addendum Note (Signed)
 Addended by: NORLEEN LYNWOOD ORN on: 07/03/2024 09:10 PM   Modules accepted: Orders, Level of Service

## 2024-07-03 NOTE — Assessment & Plan Note (Signed)
 BP Readings from Last 3 Encounters:  07/01/24 (!) 160/88  06/03/24 (!) 148/100  05/09/23 126/82   Uncontrolled on recent start diovan  80 mg, pt for increased diovan  320 mg every day, f/u bp at home and next visit

## 2024-07-03 NOTE — Assessment & Plan Note (Signed)
Last vitamin D Lab Results  Component Value Date   VD25OH 33.28 03/30/2022   Low, to start oral replacement  

## 2024-07-15 ENCOUNTER — Telehealth: Payer: Self-pay

## 2024-07-15 ENCOUNTER — Encounter: Payer: Self-pay | Admitting: Internal Medicine

## 2024-07-15 MED ORDER — AMLODIPINE BESYLATE 5 MG PO TABS: 5.0000 mg | ORAL_TABLET | Freq: Every day | ORAL | 3 refills | Status: AC

## 2024-07-15 NOTE — Addendum Note (Signed)
 Addended by: NORLEEN LYNWOOD ORN on: 07/15/2024 02:58 PM   Modules accepted: Orders

## 2024-07-15 NOTE — Telephone Encounter (Signed)
See other note thanks

## 2024-07-15 NOTE — Telephone Encounter (Signed)
 Copied from CRM [id number]. Topic: General - Call Back - No Documentation >> Jul 15, 2024  8:50 AM Amy B wrote: Reason for CRM: Patient states he was told to call the clinic if his BP dropped.  There is nothing documented in his chart noting this.  He states he will upload a pic of his BP readings into MyChart.

## 2024-07-15 NOTE — Telephone Encounter (Signed)
 So the BP by pt home record is improved over last 5 days with higher valsartan , but still mildly high  This is what I suspected might happen, as some patients have resistant BP more difficult to control  The valsartan  is at the highest dose already, so  A safe and very effective second medication is commonly prescribed called Amlodipine 5 mg per day.   This is very mild and essentially little to no significant side effects (except for feet swelling but usually at the higher 10 mg dose).  This medication is so mild, it usually takes 3 wks to have the full effect, but is so easy to take, that's why it is common  Ok to let pt know that I will send amlodipine 5 mg.  This is easy to take but he should give it at least 2-3 wks to get the best lower readings.    Remember, he should also continue the valsartan  with this.    We would like to see him back in the office in 3 wks or soon after to recheck the BP   -   thanks

## 2024-07-17 NOTE — Telephone Encounter (Signed)
 Message has been sent Via Mychart.

## 2024-12-30 ENCOUNTER — Ambulatory Visit: Admitting: Internal Medicine

## 2025-02-10 ENCOUNTER — Ambulatory Visit: Admitting: Physician Assistant
